# Patient Record
Sex: Male | Born: 1955
Health system: Southern US, Community
[De-identification: ages and names within clinical notes are randomized; demographics above are authoritative.]

## PROBLEM LIST (undated history)

## (undated) DIAGNOSIS — T7840XA Allergy, unspecified, initial encounter: Secondary | ICD-10-CM

## (undated) DIAGNOSIS — I10 Essential (primary) hypertension: Secondary | ICD-10-CM

## (undated) DIAGNOSIS — M1990 Unspecified osteoarthritis, unspecified site: Secondary | ICD-10-CM

## (undated) DIAGNOSIS — K5790 Diverticulosis of intestine, part unspecified, without perforation or abscess without bleeding: Secondary | ICD-10-CM

## (undated) HISTORY — DX: Unspecified osteoarthritis, unspecified site: M19.90

## (undated) HISTORY — DX: Diverticulosis of intestine, part unspecified, without perforation or abscess without bleeding: K57.90

## (undated) HISTORY — PX: JOINT REPLACEMENT: SHX530

## (undated) HISTORY — PX: CARPAL TUNNEL RELEASE: SHX101

## (undated) HISTORY — DX: Allergy, unspecified, initial encounter: T78.40XA

## (undated) HISTORY — DX: Essential (primary) hypertension: I10

## (undated) HISTORY — DX: Morbid (severe) obesity due to excess calories: E66.01

---
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
## 2000-08-31 ENCOUNTER — Encounter: Admission: RE | Admit: 2000-08-31 | Discharge: 2000-08-31 | Payer: Self-pay | Admitting: Family Medicine

## 2000-08-31 ENCOUNTER — Encounter: Payer: Self-pay | Admitting: Family Medicine

## 2005-10-23 ENCOUNTER — Ambulatory Visit: Payer: Self-pay | Admitting: Sports Medicine

## 2005-11-16 ENCOUNTER — Encounter: Admission: RE | Admit: 2005-11-16 | Discharge: 2006-02-14 | Payer: Self-pay | Admitting: Family Medicine

## 2006-02-03 ENCOUNTER — Encounter: Admission: RE | Admit: 2006-02-03 | Discharge: 2006-02-03 | Payer: Self-pay | Admitting: Family Medicine

## 2006-12-22 ENCOUNTER — Ambulatory Visit: Payer: Self-pay | Admitting: Family Medicine

## 2006-12-30 ENCOUNTER — Ambulatory Visit: Payer: Self-pay | Admitting: Family Medicine

## 2007-02-22 ENCOUNTER — Inpatient Hospital Stay (HOSPITAL_COMMUNITY): Admission: RE | Admit: 2007-02-22 | Discharge: 2007-02-26 | Payer: Self-pay | Admitting: Orthopedic Surgery

## 2007-02-24 ENCOUNTER — Ambulatory Visit: Payer: Self-pay | Admitting: Physical Medicine & Rehabilitation

## 2008-03-08 ENCOUNTER — Ambulatory Visit: Payer: Self-pay | Admitting: Family Medicine

## 2009-07-17 ENCOUNTER — Ambulatory Visit: Payer: Self-pay | Admitting: Family Medicine

## 2009-08-26 LAB — HM COLONOSCOPY: HM Colonoscopy: NORMAL

## 2011-02-13 ENCOUNTER — Other Ambulatory Visit: Payer: Self-pay | Admitting: Family Medicine

## 2011-02-17 NOTE — H&P (Signed)
Jeremy Ward, Jeremy Ward             ACCOUNT NO.:  [id number]   MEDICAL RECORD NO.:  [id number]          PATIENT TYPE:  INP   LOCATION:  NA                           FACILITY:  [facility]   PHYSICIAN:  Madlyn Frankel. Charlann Boxer, M.D.  DATE OF BIRTH:  1956-08-10   DATE OF ADMISSION:  DATE OF DISCHARGE:                              HISTORY & PHYSICAL   Procedure will be a right total hip replacement.   CHIEF COMPLAINTS:  Right hip pain.   HISTORY OF PRESENT ILLNESS:  Mr. Dilger is a 55 year old male with a  history of groin and buttock discomfort.  Evaluation had revealed severe  right hip osteoarthritis.  He has been refractory to all conservative  treatments including oral anti-inflammatories and cortisone injections.  He is seen by primary care physician, Dr. Sharlot Gowda.   PAST MEDICAL HISTORY:  Includes:  1. Osteoarthritis.  2. Borderline diabetes.  3. Scoliosis.  4. Hepatitis C diagnosed in 1980 as non A, non B.   FAMILY HISTORY:  Heart disease, hypertension, arthritis, gout.   SOCIAL HISTORY:  He is married.  Primary caregiver after surgery will be  wife and daughter.   DRUG ALLERGIES:  No known drug allergies.   MEDICATIONS:  Include HCTZ, verapamil, K-Dur, Nasonex and tramadol.   REVIEW OF SYSTEMS:  None other than HPI.   PHYSICAL EXAMINATION:  Pulse 72, respirations 18, blood pressure 144/88.  GENERAL:  He is awake, alert and oriented, well-developed, well-  nourished, no acute distress.  NECK:  Supple.  No carotid bruits.  CHEST/LUNGS:  Clear to auscultation bilaterally.  BREASTS:  Deferred.  HEART:  Regular rate and rhythm without gallops, clicks, rubs or  murmurs.  ABDOMEN:  Soft, nontender, nondistended.  Obese.  GENITOURINARY:  Deferred.  EXTREMITIES:  Decreased range of motion to right hip.  Painful with any  range of motion.  SKIN:  Dorsalis pedis pulses positive, right lower extremity.  No signs  of cellulitis.  NEUROLOGIC:  Intact distal sensibilities.   Labs,  EKG, chest x-ray are all pending presurgical clearance.   IMPRESSION:  1. Right hip osteoarthritis.  2. Borderline diabetes.  3. Scoliosis.  4. Hepatitis non A non B.   PLAN OF ACTION:  Right total hip replacement, Dr. Durene Romans, [facility], Feb 22, 2007.  Risks and complications were discussed.  Questions were encouraged, answered and reviewed.     ______________________________  Yetta Glassman [address], [place]      Madlyn Frankel. Charlann Boxer, M.D.  Electronically Signed    BLM/MEDQ  D:  02/16/2007  T:  02/16/2007  Job:  [id number]

## 2011-02-17 NOTE — Op Note (Signed)
Jeremy Ward, Jeremy Ward             ACCOUNT NO.:  [id number]   MEDICAL RECORD NO.:  [id number]          PATIENT TYPE:  INP   LOCATION:  0003                         FACILITY:  [facility]   PHYSICIAN:  Madlyn Frankel. Charlann Boxer, M.D.  DATE OF BIRTH:  1956/03/16   DATE OF PROCEDURE:  02/22/2007  DATE OF DISCHARGE:                               OPERATIVE REPORT   PREOPERATIVE DIAGNOSIS:  Right hip osteoarthritis.   POSTOPERATIVE DIAGNOSIS:  Right hip osteoarthritis.   PROCEDURE:  Right total hip replacement.   COMPONENTS USED:  DePuy hip system with size 54 ASR cup, a Tri-Lock size  2 high-offset stem, with a 47 +5 ASR ball and adapter.   SURGEON:  Madlyn Frankel. Charlann Boxer, M.D.   ASSISTANT:  Dwyane Luo, PA-C   ANESTHESIA:  General.   BLOOD LOSS:  900 mL.   DRAINS:  None.   COMPLICATIONS:  None.   INDICATIONS FOR PROCEDURE:  Jeremy Ward is a 55 year old gentleman who  initially presented to the office in January with end-stage degenerative  changes of his right hip and he had decreased quality of life and.  As  would be normally expected, he had some concerns about proceed with  surgical intervention but his quality of life and pain level eventually  led to the decision for hip replacement.  We had reviewed the risks and  benefits of infection, dislocation, need for revision surgery, DVT, all  in exchange for the possibility of pain relief.  Consent was obtained.   PROCEDURE IN DETAIL:  The patient was brought to the operative theater.  Once adequate anesthesia and preoperative antibiotics, 2 g of Ancef,  were administered, the patient was positioned in the left lateral  decubitus position.  Jeremy Ward is morbidly obese with a weight of  reported 350 pounds but appears a little bit heavier.  His right lower  extremity was then prepped into a sterile field from the perineum down  to the mid leg with following a pre scrub.   A lateral-based incision was made for a posterior approach to the hip as  there was a significant probably 4-5 inch subcutaneous fat layer even in  the lateral position.  The iliotibial band and gluteus fascia were  incised in line with the incision for a posterior approach to the hip  and the short external rotators were taken down separate from the  posterior capsule.  The patient was noted to have severe degenerative  changes and the capsule was fairly ratty.  I kept the posterior capsular  leaflet for protection from the retractors against the sciatic nerve but  was unable to repair this postprocedure so a capsulectomy ended up being  performed at the end.  Following exposure, the hip was dislocated.  The  patient was noted to have severe degenerative changes in both the  femoral and acetabular sides with osteophytes present on the posterior  aspect and an osteophyte of the labrum identified.   At this point using a trial prosthesis, a neck osteotomy was made.   Attention was first directed to the femur.  The box osteotome was used  to help set orientation of the rotation at 20 degrees anteversion.  I  then used a starting reamer and a hand reamer to open up the canal to  prevent fat emboli.  The canal was irrigated.  I then began broaching  with a 0, then a 1, and then a 2 .  With the 2 I had it just at the  level of the neck cut with good, secure fit.  I was unable to get this  any further so this stopped at this level.  I went ahead and packed the  canal to prevent excessive bleeding.   At this point attention was directed to the acetabulum.  Acetabular  exposure playing carried out with retractor placement.  Ossified labrum  and a portion of the superior capsule were debrided to allow for further  exposure.  I began reaming with a 45 straight reamer and then used the  curved reamers up to a 53 reamer.  It had some sclerotic bone that I  ended up reaming through with the smaller, even-numbered reamers with  the 50 and then the 52 reamer.  This allowed  for good bony bed  preparation.  Following the final irrigation and assurance of  debridement of the rim of the acetabulum, a 54 ASR cup was impacted with  a 35-40 degrees of abduction and 20 degrees of forward flexion beneath  the anterior wall anatomically positioned.   At this point attention was redirected back to the femur for trial  reduction.  The size 2 stem was impacted and then the high-offset neck  placed.  I felt that a +5 adapter worked best with a 47 ASR ball.  At  this point all the trial components were removed and the final Tri-Lock  size 2 stem high-offset stem was then impacted into a clean and prepared  canal.  Given the location of where it sat and the security of the final  fit, I used a +5 adapter with a 47 ASR ball.  This was impacted onto a  clean and dry trunion and the hip reduced.  Throughout the case the hip  was irrigated.  Hemostasis was carried out throughout the case including  the subcutaneous fat layer and then the posterior capsular tissue.  At  this point I determined that the posterior capsule and superior capsule  were unable to be reapproximated due to the debride purposes from the  significant arthritic and degenerative changes present to it.  I used 10  mL of FloSeal into the inferior and posterior capsular soft tissue  region and saved a small portion of it for the subcutaneous fat area,  particularly posteriorly.  The iliotibial band was reapproximated using  the #1 Vicryl and #1 Ethilon.  I then used #1 Vicryl running to the  gluteal fascia.  The remainder of layer was closed with 2-0 Vicryl in  the subcu layer and then running 4-0 Monocryl.  The hip was then  cleaned, dried and dressed sterilely with Steri-Strips and a Mepilex  dressing.  The patient was then extubated and brought to recovery in  stable condition.      Madlyn Frankel Charlann Boxer, M.D.  Electronically Signed    MDO/MEDQ  D:  02/22/2007  T:  02/22/2007  Job:  [id number]

## 2011-02-20 NOTE — Discharge Summary (Signed)
Jeremy Ward, Jeremy Ward             ACCOUNT NO.:  [id number]   MEDICAL RECORD NO.:  [id number]          PATIENT TYPE:  INP   LOCATION:  1605                         FACILITY:  [facility]   PHYSICIAN:  Madlyn Frankel. Charlann Boxer, M.D.  DATE OF BIRTH:  Jun 15, 1956   DATE OF ADMISSION:  02/22/2007  DATE OF DISCHARGE:  02/26/2007                               DISCHARGE SUMMARY   ADMITTING DIAGNOSES:  1. Osteoarthritis.  2. Borderline diabetes.  3. Scoliosis.  4. Hepatitis C.   DISCHARGE DIAGNOSES:  1. Osteoarthritis.  2. Borderline diabetes.  3. Scoliosis.  4. Hepatitis C.  5. Postoperative hypokalemia.   CONSULTS:  None.   PROCEDURE:  Right total hip replacement by surgeon Dr. Durene Romans.  Components:  Metal on metal.   HISTORY OF PRESENT ILLNESS:  Jeremy Ward is a 55 year old male with a  history of groin and buttock discomfort secondary to osteoarthritis.  It  has been refractory to all conservative treatments.  He was seen by his  primary care physician in presurgical assessment by Dr. Sharlot Gowda.   LABORATORIES ON ADMISSION:  CBC all stable, hematocrit 43.7.  Upon  discharge, hematocrit 31.3.  Coagulation within normal limits.  Chemistries preadmission:  Potassium 3.4, glucose 107.  Postoperative  hypokalemia 3.3, glucose 118.  May 22:  Sodium 134, glucose 107.  At  discharge, glucose 143.  All other stable and within normal limits.  GFR  checked, remained stable of greater than 60.  Calcium checked.  No  significant trending, at discharge calcium 8.5.  Routine chemistry GI  all within normal limits.  UA negative.  Type and cross A positive.  EKG:  Normal sinus rhythm.  Chest two-view:  No active cardiopulmonary  disease.   HOSPITAL COURSE:  The patient underwent right total hip placement,  tolerated the procedure well, was admitted to the orthopedic floor.  Pain was well-controlled throughout.  He was neurovascularly intact to  his right lower extremity throughout.  Dressing was  changed after  postoperative day #1 on a daily basis with no active drainage.  Physical  therapy started on postoperative day #1, progressed nicely throughout,  was a little bit apprehensive and worried about his progress but he did  progress to the point of minimal assistance with the use of rolling  walker prior to discharge.  Blood thinners were started on postoperative  day #1.  The patient was able to self-administer before discharge.  Mild  postoperative hypokalemia was replenished after postoperative day #1  with some K-Dur.  Postoperative day #3 still had not had a bowel  movement since surgery.  Between day #3 and day #4 did have a bowel  movement, was ready for discharge home, had progressed nicely, was  ambulating with minimal assistance with the use of a rolling walker.   DISCHARGE DISPOSITION:  Discharged home in stable and improved condition  with home health care physical therapy selected.   DISCHARGE DIET:  Regular.   DISCHARGE WOUND CARE:  Keep dry and change dressing on daily basis.   DISCHARGE ACTIVITY:  Walk with assistance with the use of a rolling  walker, weightbearing as tolerated.   DISCHARGE MEDICATIONS:  1. Lovenox 40 mg subcu q.24h. x11 days.  2. Robaxin 500 mg one p.o. q.6h. p.r.n. muscle spasm pain.  3. Vicodin 5/325 one to two p.o. q.4-6h. p.r.n. pain.  4. Iron 325 mg one p.o. t.i.d. x3 weeks.  5. Enteric-coated aspirin 325 mg one p.o. daily x4 weeks after Lovenox      complete.  6. Colace 1 mg p.o. b.i.d. constipation.  7. MiraLax 17 g one p.o. daily constipation.   Home medications:  1. Verapamil 360 mg one p.o. q.a.m.  2. Klor-Con 20 mEq two tablets daily.  3. Hydrochlorothiazide 25 mg one p.o. daily.  4. Nasonex nasal spray q.h.s.   DISCHARGE FOLLOWUP:  1. Follow up with Dr. Charlann Boxer [phone] in 2 weeks.  2. Discharge physical therapy.  Goals of physical therapy will be      weightbearing as tolerated with the use of a rolling walker with       transition to single-point cane in 2 weeks.  Want to minimize pain,      maximize range of motion, increase strength, and work on gait      retraining.  Want to work on proprioception as well.     ______________________________  Yetta Glassman. [address], [place]      Madlyn Frankel. Charlann Boxer, M.D.  Electronically Signed    BLM/MEDQ  D:  03/24/2007  T:  03/24/2007  Job:  [id number]

## 2011-05-05 ENCOUNTER — Other Ambulatory Visit: Payer: Self-pay | Admitting: Orthopedic Surgery

## 2011-05-05 DIAGNOSIS — M25551 Pain in right hip: Secondary | ICD-10-CM

## 2011-05-06 ENCOUNTER — Ambulatory Visit
Admission: RE | Admit: 2011-05-06 | Discharge: 2011-05-06 | Disposition: A | Discharge status: Home / Self Care | Payer: 59 | Source: Ambulatory Visit | Attending: Orthopedic Surgery | Admitting: Orthopedic Surgery

## 2011-05-06 DIAGNOSIS — M25551 Pain in right hip: Secondary | ICD-10-CM

## 2011-05-06 MED ORDER — IOHEXOL 180 MG/ML  SOLN
1.0000 mL | Freq: Once | INTRAMUSCULAR | Status: AC | PRN
  Administered 2011-05-06: 1 mL via INTRA_ARTICULAR

## 2011-05-15 ENCOUNTER — Other Ambulatory Visit: Payer: Self-pay | Admitting: Family Medicine

## 2011-05-15 NOTE — Telephone Encounter (Signed)
Pt needs an appointment

## 2011-06-12 ENCOUNTER — Ambulatory Visit (INDEPENDENT_AMBULATORY_CARE_PROVIDER_SITE_OTHER): Payer: BC Managed Care – PPO | Admitting: Family Medicine

## 2011-06-12 ENCOUNTER — Encounter: Payer: Self-pay | Admitting: Family Medicine

## 2011-06-12 VITALS — BP 132/88 | HR 82 | Wt 340.0 lb

## 2011-06-12 DIAGNOSIS — Z23 Encounter for immunization: Secondary | ICD-10-CM

## 2011-06-12 DIAGNOSIS — M25559 Pain in unspecified hip: Secondary | ICD-10-CM

## 2011-06-12 DIAGNOSIS — M25551 Pain in right hip: Secondary | ICD-10-CM

## 2011-06-12 DIAGNOSIS — I10 Essential (primary) hypertension: Secondary | ICD-10-CM

## 2011-06-12 DIAGNOSIS — Z01818 Encounter for other preprocedural examination: Secondary | ICD-10-CM

## 2011-06-12 DIAGNOSIS — Z79899 Other long term (current) drug therapy: Secondary | ICD-10-CM

## 2011-06-12 NOTE — Progress Notes (Signed)
  Subjective:    Patient ID: Jeremy Ward, male    DOB: 07-03-1956, 55 y.o.   MRN: [id number]  HPI Is here for surgical clearance for hip revision. He has a Depuy appliance which has been recalled. He has no chest pain, shortness of breath or other cardial respiratory symptoms. He is on blood pressure medications. He has no other concerns or complaints. He is using tramadol for pain but it is causing difficulty with constipation as well as a strange dreams.   Review of Systems Negative except as above.    Objective:   Physical Exam alert and in no distress. Tympanic membranes and canals are normal. Throat is clear. Tonsils are normal. Neck is supple without adenopathy or thyromegaly. Cardiac exam shows a regular sinus rhythm without murmurs or gallops. Lungs are clear to auscultation. EKG does show slight ST depressions compared to previous tracing.        Assessment & Plan:   1. Hypertension   2. Encounter for long-term (current) use of other medications   3. Right hip pain    obesity  abnormal EKG: Refer to cardiology before any surgery.

## 2011-06-13 LAB — LIPID PANEL
HDL: 50 mg/dL (ref >39)
Total CHOL/HDL Ratio: 3.5 Ratio
VLDL: 24 mg/dL (ref 0–40)

## 2011-06-13 LAB — CBC WITH DIFFERENTIAL/PLATELET
Basophils Absolute: 0 10*3/uL (ref 0.0–0.1)
Basophils Relative: 0 % (ref 0–1)
Eosinophils Absolute: 0.1 10*3/uL (ref 0.0–0.7)
Eosinophils Relative: 3 % (ref 0–5)
Lymphocytes Relative: 30 % (ref 12–46)
MCHC: 33.2 g/dL (ref 30.0–36.0)
MCV: 89.6 fL (ref 78.0–100.0)
Platelets: 185 10*3/uL (ref 150–400)
RDW: 14.7 % (ref 11.5–15.5)
WBC: 5 10*3/uL (ref 4.0–10.5)

## 2011-06-13 LAB — COMPREHENSIVE METABOLIC PANEL
ALT: 13 U/L (ref 0–53)
AST: 16 U/L (ref 0–37)
Alkaline Phosphatase: 57 U/L (ref 39–117)
BUN: 14 mg/dL (ref 6–23)
Creat: 1 mg/dL (ref 0.50–1.35)
Total Bilirubin: 0.7 mg/dL (ref 0.3–1.2)

## 2011-06-16 ENCOUNTER — Telehealth: Payer: Self-pay

## 2011-06-16 ENCOUNTER — Encounter: Payer: Self-pay | Admitting: Family Medicine

## 2011-06-16 NOTE — Telephone Encounter (Signed)
Called pt to inform of labs and to recheck potas. In a week or 2 left message

## 2011-06-17 ENCOUNTER — Other Ambulatory Visit: Payer: Self-pay | Admitting: Orthopedic Surgery

## 2011-06-17 ENCOUNTER — Other Ambulatory Visit (HOSPITAL_COMMUNITY): Payer: Self-pay | Admitting: Orthopedic Surgery

## 2011-06-17 ENCOUNTER — Encounter (HOSPITAL_COMMUNITY): Payer: BC Managed Care – PPO

## 2011-06-17 ENCOUNTER — Ambulatory Visit (HOSPITAL_COMMUNITY)
Admission: RE | Admit: 2011-06-17 | Discharge: 2011-06-17 | Disposition: A | Discharge status: Home / Self Care | Payer: BC Managed Care – PPO | Source: Ambulatory Visit | Attending: Orthopedic Surgery | Admitting: Orthopedic Surgery

## 2011-06-17 DIAGNOSIS — T8389XA Other specified complication of genitourinary prosthetic devices, implants and grafts, initial encounter: Secondary | ICD-10-CM | POA: Insufficient documentation

## 2011-06-17 DIAGNOSIS — Z01811 Encounter for preprocedural respiratory examination: Secondary | ICD-10-CM

## 2011-06-17 DIAGNOSIS — I10 Essential (primary) hypertension: Secondary | ICD-10-CM | POA: Insufficient documentation

## 2011-06-17 DIAGNOSIS — Z01812 Encounter for preprocedural laboratory examination: Secondary | ICD-10-CM | POA: Insufficient documentation

## 2011-06-17 DIAGNOSIS — Z01818 Encounter for other preprocedural examination: Secondary | ICD-10-CM | POA: Insufficient documentation

## 2011-06-17 DIAGNOSIS — Y831 Surgical operation with implant of artificial internal device as the cause of abnormal reaction of the patient, or of later complication, without mention of misadventure at the time of the procedure: Secondary | ICD-10-CM | POA: Insufficient documentation

## 2011-06-17 LAB — CBC
MCH: 29.8 pg (ref 26.0–34.0)
MCV: 87 fL (ref 78.0–100.0)
Platelets: 200 10*3/uL (ref 150–400)
RBC: 5.17 MIL/uL (ref 4.22–5.81)
RDW: 14.1 % (ref 11.5–15.5)
WBC: 7.2 10*3/uL (ref 4.0–10.5)

## 2011-06-17 LAB — URINALYSIS, ROUTINE W REFLEX MICROSCOPIC
Bilirubin Urine: NEGATIVE
Ketones, ur: NEGATIVE mg/dL
Leukocytes, UA: NEGATIVE
Nitrite: NEGATIVE
Specific Gravity, Urine: 1.018 (ref 1.005–1.030)
Urobilinogen, UA: 0.2 mg/dL (ref 0.0–1.0)
pH: 6 (ref 5.0–8.0)

## 2011-06-17 LAB — BASIC METABOLIC PANEL
BUN: 15 mg/dL (ref 6–23)
CO2: 28 mEq/L (ref 19–32)
Calcium: 10.2 mg/dL (ref 8.4–10.5)
Chloride: 102 mEq/L (ref 96–112)
Creatinine, Ser: 0.81 mg/dL (ref 0.50–1.35)

## 2011-06-17 LAB — DIFFERENTIAL
Basophils Relative: 1 % (ref 0–1)
Eosinophils Absolute: 0.2 10*3/uL (ref 0.0–0.7)
Eosinophils Relative: 3 % (ref 0–5)
Neutrophils Relative %: 61 % (ref 43–77)

## 2011-06-17 LAB — APTT: aPTT: 36 seconds (ref 24–37)

## 2011-06-18 ENCOUNTER — Telehealth: Payer: Self-pay | Admitting: Family Medicine

## 2011-06-18 NOTE — Telephone Encounter (Signed)
Have him come back for recheck on his blood but make sure you take an extra potassium

## 2011-06-19 LAB — MRSA CULTURE

## 2011-06-22 ENCOUNTER — Other Ambulatory Visit: Payer: Self-pay | Admitting: Orthopedic Surgery

## 2011-06-22 ENCOUNTER — Inpatient Hospital Stay (HOSPITAL_COMMUNITY): Payer: BC Managed Care – PPO

## 2011-06-22 ENCOUNTER — Inpatient Hospital Stay (HOSPITAL_COMMUNITY)
Admission: RE | Admit: 2011-06-22 | Discharge: 2011-06-24 | DRG: 817 | Disposition: A | Discharge status: Home Health | Payer: BC Managed Care – PPO | Source: Ambulatory Visit | Attending: Orthopedic Surgery | Admitting: Orthopedic Surgery

## 2011-06-22 DIAGNOSIS — Z01812 Encounter for preprocedural laboratory examination: Secondary | ICD-10-CM

## 2011-06-22 DIAGNOSIS — T84099A Other mechanical complication of unspecified internal joint prosthesis, initial encounter: Principal | ICD-10-CM | POA: Diagnosis present

## 2011-06-22 DIAGNOSIS — Z01818 Encounter for other preprocedural examination: Secondary | ICD-10-CM

## 2011-06-22 DIAGNOSIS — E876 Hypokalemia: Secondary | ICD-10-CM | POA: Diagnosis not present

## 2011-06-22 DIAGNOSIS — I10 Essential (primary) hypertension: Secondary | ICD-10-CM | POA: Diagnosis present

## 2011-06-22 DIAGNOSIS — Y831 Surgical operation with implant of artificial internal device as the cause of abnormal reaction of the patient, or of later complication, without mention of misadventure at the time of the procedure: Secondary | ICD-10-CM | POA: Diagnosis present

## 2011-06-22 DIAGNOSIS — Z96649 Presence of unspecified artificial hip joint: Secondary | ICD-10-CM

## 2011-06-22 DIAGNOSIS — Z8619 Personal history of other infectious and parasitic diseases: Secondary | ICD-10-CM

## 2011-06-22 LAB — TYPE AND SCREEN
ABO/RH(D): A POS
Antibody Screen: NEGATIVE

## 2011-06-23 LAB — BASIC METABOLIC PANEL
Calcium: 8 mg/dL — ABNORMAL LOW (ref 8.4–10.5)
GFR calc Af Amer: >60 mL/min (ref >60)
GFR calc non Af Amer: >60 mL/min (ref >60)
Glucose, Bld: 108 mg/dL — ABNORMAL HIGH (ref 70–99)
Potassium: 3 mEq/L — ABNORMAL LOW (ref 3.5–5.1)
Sodium: 134 mEq/L — ABNORMAL LOW (ref 135–145)

## 2011-06-23 LAB — CBC
Hemoglobin: 11.7 g/dL — ABNORMAL LOW (ref 13.0–17.0)
MCH: 29.5 pg (ref 26.0–34.0)
MCHC: 33.6 g/dL (ref 30.0–36.0)
Platelets: 170 10*3/uL (ref 150–400)
RDW: 14 % (ref 11.5–15.5)

## 2011-06-24 LAB — CBC
MCH: 30 pg (ref 26.0–34.0)
Platelets: 157 10*3/uL (ref 150–400)
RBC: 3.6 MIL/uL — ABNORMAL LOW (ref 4.22–5.81)

## 2011-06-24 LAB — BASIC METABOLIC PANEL
CO2: 28 mEq/L (ref 19–32)
Calcium: 8.1 mg/dL — ABNORMAL LOW (ref 8.4–10.5)
GFR calc non Af Amer: >60 mL/min (ref >60)
Potassium: 2.7 mEq/L — CL (ref 3.5–5.1)
Sodium: 135 mEq/L (ref 135–145)

## 2011-06-24 NOTE — Telephone Encounter (Signed)
Spoke with pt and pt stated that he was in hospital for knee surgery.  He wanted you to know this and that his potassium is up a little.  Pt also stated that he will come in for a nurse visit in a few weeks to get it checked again.  CM,LPN

## 2011-06-25 NOTE — Op Note (Signed)
[name], [name] NO.:  [id number]  MEDICAL RECORD NO.:  [id number]  LOCATION:  0009                         FACILITY:  [facility]  PHYSICIAN:  Madlyn Frankel. Charlann Boxer, M.D.  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  FINDINGS:  The patient was noted to have some metal staining.  No evidence of muscle or bone damage or necrosis.  Small fluid collection noted within the acetabular region.  PROCEDURE:  Revision right hip surgery with removal of ASR head and cup, placement of a 56 Gription Pinnacle cup with a 36 +4 neutral AltrX liner and 36 +5 Delta ceramic ball.  SURGEON:  Madlyn Frankel. Charlann Boxer, MD  ASSISTANT:  Lanney Gins, PA-C  ANESTHESIA:  General.  SPECIMENS:  Femoral head and cup were sent to the Pathology per attorney request.  DRAINS:  One Hemovac.  BLOOD LOSS:  About 500 cc.  INDICATIONS OF PROCEDURE:  Jeremy Ward is a 55 year old patient of mine with history of a right total hip replacement done utilizing ASR hip system.  He had initially done well with regard to his hip, however, began having some discomfort over the past year or so.  The lab work revealed mildly elevated serum cobalt chromium levels.  In March, MRI revealed fluid collection.  Given his symptoms and these findings, he wished at this point to have his hip revised.  Risks of infection, DVT, component failure, dislocation, revision, setting were all discussed and reviewed.  Consent was obtained for benefit of pain relief.  PROCEDURE IN DETAIL:  The patient was brought to operative theater. Once adequate anesthesia, preoperative antibiotics, Ancef 2  g administered, he was positioned into the left lateral decubitus position with the right side up.  The right lower extremity was then prepped and draped in sterile fashion.  A time-out was performed identifying the patient, the planned procedure, and extremity.  The patient's old incision was identified, I marked this out, but extended it about 2 cm on the proximal distal end.  The old incision was excised, sharp dissection was carried down identifying a layer within the subcutaneous fat of a pseudocapsular area that I excised the pseudocapsular tissue and then turned debridement, cauterizing as we went down.  The iliotibial band and gluteal fascia were identified and then incised for posterior approach.  Once I was down on the posterior aspect of the capsule and once I incised the pseudocapsular layer, we identified the metal-stained fluid as well as a metal-stained synovial lining.  This was debrided sharply as I exposed the posterior two thirds of the acetabulum.  Once this area was exposed, the hip was dislocated and the femoral head removed.  The mobilization of the patient's hip was significantly compromised due to his size.  For that reason, required a significant amount of effort from physician assistant in terms of retraction and holding in leveraging the femur out  of place using a retractor using a bone hook.  I was able to remove the acetabular shell using standard osteotomes after going around the two-thirds posterior and to the proximal and distal portions and the cup was removed.  I then reamed.  Once it was a 54-mm cup, so I reamed with a 55 reamer, removing any of the fibers based down to good base bone.  A 56 Gription cup was chosen.  Based on limited mobility of his femur in relationship to the acetabulum, I had to placed the shell into the acetabulum itself. Once I had it oriented correctly by tapping it down with some pressure applied for a large femoral head  impactor, I felt that I had the cup position at about 40 degrees of abduction and positioned 20 degrees of forward flexion with the anterior rim exposed above the cup.  The cup was then impacted and then well seated and confirmed visually with the use of a Frazier tip suction.  I placed 2 cancellous screws into the ilium.  Based on the orientation of the cup and the fixation, I went ahead and placed a hole eliminator and placed a 36 +4 neutral AltrX liner which was impacted with good visualized rim fit.  At this point, I did a trial reduction with 36 +1.5 ball.  I chose a 36 +5 ball based on the trial reduction and the fact that the old ball had a +5 adapter on it.  The final 36 +5 Delta ceramic ball was then impacted onto clean and dry trunnion and the hip was reduced.  The hip had been irrigated throughout the case and again at this point. I did not reapproximate the pseudocapsule posteriorly.  I placed a medium Hemovac drain deep.  The iliotibial band and gluteal fascia were then reapproximated over top of this using #1 Vicryl.  The remainder of wound was closed with 2-0 Vicryl and running 4-0 Monocryl and staples on the skin.  The skin was cleaned, dried, and dressed sterilely using an Aquacel dressing.  The patient was brought to recovery room in stable condition, tolerating the procedure well.     Madlyn Frankel Charlann Boxer, M.D.     MDO/MEDQ  D:  06/22/2011  T:  06/22/2011  Job:  [id number]  Electronically Signed by Durene Romans M.D. on 06/25/2011 08:40:00 PM

## 2011-06-25 NOTE — H&P (Signed)
NAMEKAIZER, DISSINGER NO.:  [id number]  MEDICAL RECORD NO.:  [id number]  LOCATION:                                 FACILITY:  PHYSICIAN:  Madlyn Frankel. Charlann Boxer, M.D.  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  HISTORY OF PRESENT ILLNESS:  This is a 55 year old gentleman with a history of an ASR total hip arthroplasty on the right in 2008, with metallosis, pain, and failure of the prosthesis.  He is now scheduled for revision of this.  The surgery risks, benefits, and aftercare were discussed in detail with the patient.  Questions invited and answered. Surgery to go ahead as scheduled.  Note that he is a candidate for tranexamic acid, will receive that preop.  He also will be going home after surgery in the care of his wife.  He is given his postop medications of aspirin, Robaxin, iron, MiraLax and Colace to take postoperatively and his medical doctor is Dr. Viann Fish.  PAST MEDICAL HISTORY:  None.  DRUG ALLERGIES:  None.  CURRENT MEDICATIONS: 1. Verapamil 360 mg one daily. 2. Hydrochlorothiazide 25 mg one daily.  PREVIOUS SURGERIES:  Tonsillectomy, bilateral eyes surgery, left carpal tunnel release, and hip replacement on the right.  SERIOUS MEDICAL ILLNESSES:  Include hypertension.  FAMILY HISTORY:  Positive for COPD and emphysema.  SOCIAL HISTORY:  The patient is married.  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  He does not smoke and does not drink.  REVIEW OF SYSTEMS:  CENTRAL NERVOUS SYSTEM:  Negative for headache, blurred vision, or dizziness.  PULMONARY: Negative for shortness breath, PND, and orthopnea.  CARDIOVASCULAR:  No chest pain or palpitation.  GI: Negative for ulcers, hepatitis.  GU:  Negative for urinary tract difficulty.  MUSCULOSKELETAL:  Positive in  HPI.  PHYSICAL EXAMINATION:  VITAL SIGNS:  BP 164/84, respirations 20, pulse 80 and regular. GENERAL APPEARANCE:  This is an obese gentleman in no acute distress. HEENT:  Head normocephalic.  Nose patent.  Ears patent.  Pupils are equal, round, reactive to light.  He does have slight decreased range of motion of his eyes secondary to previous eye muscle surgery. NECK:  Supple without adenopathy.  Carotids 2+ without bruit. CHEST:  Clear to auscultation.  No rales or rhonchi.  Respirations 20. HEART:  Regular rate and rhythm at 80 beats per minute without murmur. ABDOMEN:  Soft.  Active bowel sounds.  No masses or organomegaly. NEUROLOGIC:  The patient is alert and oriented to time, place, and person.  Cranial nerves II-XII grossly intact. EXTREMITIES:  Shows the right hip with decreased range of motion with pain especially on internal rotation.  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  PLAN:  Revision, right total hip arthroplasty.     Jaquelyn Bitter. Chabon, P.A.______________________________ Madlyn Frankel Charlann Boxer, M.D.    SJC/MEDQ  D:  06/17/2011  T:  06/17/2011  Job:  [id number]  Electronically Signed by Jodene Nam P.A. on 06/24/2011 02:19:47 PM Electronically Signed  by Durene Romans M.D. on 06/25/2011 08:39:52 PM

## 2011-06-25 NOTE — Discharge Summary (Signed)
NAMECOSTON, MANDATO NO.:  [id number]  MEDICAL RECORD NO.:  [id number]  LOCATION:  1534                         FACILITY:  [facility]  PHYSICIAN:  Madlyn Frankel. Charlann Boxer, M.D.  DATE OF BIRTH:  1956/05/20  DATE OF ADMISSION:  06/22/2011 DATE OF DISCHARGE:  06/24/2011                              DISCHARGE SUMMARY   PROCEDURE:  Revision of the right hip surgery with removal of the ASR head and cup.  ATTENDING PHYSICIAN:  Madlyn Frankel. Charlann Boxer, M.D.  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  DISCHARGE DIAGNOSES: 1. Status post right total hip revision. 2. Hypertension. 3. Hypokalemia.  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  Options were discussed with the patient.  The patient wishes to proceed with the revision.  Risks, benefits and expectations of the procedure were discussed with the patient.  The patient understands risks, benefits and expectations and wishes to proceed with revision of the right total hip.  HOSPITAL COURSE:  The patient underwent the above-stated procedure on June 22, 2011.  The patient tolerated the procedure well, was brought to the recovery room in good condition and subsequently to the floor.  Postop day #1, June 23, 2011:  The patient doing really well, no events.  The patient's pain is well-controlled, afebrile, vital signs stable.  H and H 11.7/34.8. Dressings good, clean, dry and intact. Distally and neurovascularly intact.  Hemovac drain was removed.  The patient allowed physical therapy.  Postop day #2, June 24, 2011:  The patient continues to do really well.  No incidences.  Pain is well-controlled.  The patient did however has low potassium. Potassium chloride was ordered for the  patient to be given to him twice.  The patient does have a history of this, has been recently given a prescription by his primary care physician.  H and H is 10.8 and 32.0, afebrile, vital signs stable.  Dressing is good, clean, dry and intact.  Distally, neurovascular intact.  The patient's IV will be changed to saline lock.  He is given 2 doses of potassium chloride before being discharged.  It was felt the patient was doing well enough to be discharged home.  DISCHARGE CONDITION:  Good.  DISCHARGE INSTRUCTIONS:  The patient will be discharged home on June 24, 2011.  The patient will have 2 episodes of physical therapy and 2 doses of potassium chloride before being discharged home. The patient will be weightbearing as tolerated.  The patient will have home health PT q. month and follow him.  The patient should maintain his surgical dressing for about 8 days after which time he will replace with gauze and tape.  The patient is to keep the area dry and clean until followup.  The patient will follow up with the [facility] in [facility] weeks.  The patient is to call with any questions or concerns.  DISCHARGE MEDICATIONS: 1. Benadryl 25 mg one p.o. q.4 hours p.r.n. 2. Colace 100 mg  one p.o. b.i.d. constipation. 3. Iron sulfate 325 mg one p.o. t.i.d. times 2-3 weeks. 4. Norco 7.5/325 one to two p.o. q.4-6 hours p.r.n. pain. 5. MiraLAX 17 g p.o. q. day constipation. 6. Robaxin 500 mg one p.o. q.6 hours p.r.n. muscle spasms. 7. Cetirizine 10 mg one p.o. q. day. 8. Hydrochlorothiazide 25 mg one p.o. q.a.m. 9. Klor-Con 20 mEq to take as instructed by his primary care     physician.  The patient should follow up with his primary care     physician regarding this medication. 10.Verapamil 360 mg one p.o. q.a.m.    ______________________________ Lanney Gins, PA   ______________________________ Madlyn Frankel. Charlann Boxer, M.D.    MB/MEDQ  D:  06/24/2011  T:  06/24/2011  Job:   [id number]  Electronically Signed by Lanney Gins PA on 06/25/2011 01:53:05 PM Electronically Signed by Durene Romans M.D. on 06/25/2011 08:40:03 PM

## 2011-07-02 ENCOUNTER — Other Ambulatory Visit: Payer: BC Managed Care – PPO

## 2011-07-02 DIAGNOSIS — E876 Hypokalemia: Secondary | ICD-10-CM

## 2011-07-03 ENCOUNTER — Telehealth: Payer: Self-pay

## 2011-07-03 LAB — BASIC METABOLIC PANEL
Chloride: 100 mEq/L (ref 96–112)
Glucose, Bld: 112 mg/dL — ABNORMAL HIGH (ref 70–99)
Potassium: 4.1 mEq/L (ref 3.5–5.3)
Sodium: 141 mEq/L (ref 135–145)

## 2011-07-03 NOTE — Telephone Encounter (Signed)
Called pt to inform to continue potas.until all gone and recheck in 2 months

## 2011-07-31 ENCOUNTER — Telehealth: Payer: Self-pay | Admitting: Internal Medicine

## 2011-07-31 ENCOUNTER — Telehealth: Payer: Self-pay | Admitting: Family Medicine

## 2011-07-31 NOTE — Telephone Encounter (Signed)
Let him know that we'll be happy to work with him controlling this fluid but would like to see him first to go over the various options

## 2011-07-31 NOTE — Telephone Encounter (Signed)
If he has an infection I definitely need to see him

## 2011-07-31 NOTE — Telephone Encounter (Signed)
Pt has an Ov for Monday to discuss medication increase for HCTZ

## 2011-07-31 NOTE — Telephone Encounter (Signed)
Called pt to tell him what you said but he said he went to his Dr. Charlann Boxer today and the Dr. York Spaniel he had and infection and the veins were not opening up like they should and retaining to much fluid and the dr. Asked if he could call to see if you could increase the HCTZ just for a short time until they got the infection under control

## 2011-08-03 ENCOUNTER — Encounter: Payer: Self-pay | Admitting: Family Medicine

## 2011-08-03 ENCOUNTER — Ambulatory Visit (INDEPENDENT_AMBULATORY_CARE_PROVIDER_SITE_OTHER): Payer: BC Managed Care – PPO | Admitting: Family Medicine

## 2011-08-03 VITALS — BP 126/80 | HR 76 | Wt 340.0 lb

## 2011-08-03 DIAGNOSIS — L0390 Cellulitis, unspecified: Secondary | ICD-10-CM

## 2011-08-03 DIAGNOSIS — L0291 Cutaneous abscess, unspecified: Secondary | ICD-10-CM

## 2011-08-03 DIAGNOSIS — R609 Edema, unspecified: Secondary | ICD-10-CM

## 2011-08-03 DIAGNOSIS — Z96649 Presence of unspecified artificial hip joint: Secondary | ICD-10-CM

## 2011-08-03 MED ORDER — FUROSEMIDE 20 MG PO TABS: 20.0000 mg | ORAL_TABLET | Freq: Every day | ORAL | Status: DC

## 2011-08-03 MED ORDER — HYDROXYZINE HCL 25 MG PO TABS: 25.0000 mg | ORAL_TABLET | Freq: Three times a day (TID) | ORAL | Status: AC | PRN

## 2011-08-03 NOTE — Patient Instructions (Addendum)
Keep your legs elevated. Uses support hose daily. No more Benadryl but use Claritin or Allegra for the itching. Take the hydroxyzine at night for the itching Cool compresses to the area that itch the most.

## 2011-08-03 NOTE — Progress Notes (Signed)
  Subjective:    Patient ID: Jeremy Ward, male    DOB: Oct 30, 1955, 55 y.o.   MRN: [id number]  HPI He was recently seen by his orthopedic surgeon for evaluation of swelling and redness of the right shin area. He is placed on a cephalosporin and he states that the redness and discomfort are much better. He still does have some itching especially in the calf area. He is also noted increased swelling which is having difficulty with. He states when he elevates his legs had actually makes the calf area more pruritic. He had recent hip surgery.   Review of Systems     Objective:   Physical Exam Alert and in no distress. Examination of both lower extremities show 2-3+ pitting edema in his feet. There is also slight erythema but no warmth to the right anterior shin. It has a reddish-brown appearance as opposed to reddish.       Assessment & Plan:   1. Peripheral edema   2. Cellulitis   3. S/P hip replacement    Had a long discussion with him concerning this. I will give him Atarax to help with the itching. He'll continue on the antibiotic. He is to keep his leg elevated as much is possible. He can use cold compresses on the itching areas. I will also give him a small dose of Lasix to help with the edema.

## 2011-08-07 ENCOUNTER — Telehealth: Payer: Self-pay | Admitting: Family Medicine

## 2011-08-07 MED ORDER — FLUCONAZOLE 150 MG PO TABS: 150.0000 mg | ORAL_TABLET | Freq: Once | ORAL | Status: DC

## 2011-08-07 NOTE — Telephone Encounter (Signed)
Diflucan called in for yeast infection. He has had difficulty with these symptoms before and responded well to this medicine.

## 2011-08-10 ENCOUNTER — Encounter: Payer: Self-pay | Admitting: Family Medicine

## 2011-08-10 ENCOUNTER — Ambulatory Visit (INDEPENDENT_AMBULATORY_CARE_PROVIDER_SITE_OTHER): Payer: BC Managed Care – PPO | Admitting: Family Medicine

## 2011-08-10 VITALS — BP 124/80 | HR 62 | Wt 344.0 lb

## 2011-08-10 DIAGNOSIS — L0291 Cutaneous abscess, unspecified: Secondary | ICD-10-CM

## 2011-08-10 DIAGNOSIS — L0390 Cellulitis, unspecified: Secondary | ICD-10-CM

## 2011-08-10 DIAGNOSIS — B356 Tinea cruris: Secondary | ICD-10-CM

## 2011-08-10 MED ORDER — HYDROCHLOROTHIAZIDE 25 MG PO TABS: 25.0000 mg | ORAL_TABLET | Freq: Every day | ORAL | Status: DC

## 2011-08-10 MED ORDER — CEPHALEXIN 500 MG PO CAPS: 500.0000 mg | ORAL_CAPSULE | Freq: Four times a day (QID) | ORAL | Status: DC

## 2011-08-10 MED ORDER — FLUCONAZOLE 150 MG PO TABS: 150.0000 mg | ORAL_TABLET | Freq: Once | ORAL | Status: AC

## 2011-08-10 MED ORDER — VERAPAMIL HCL ER 360 MG PO CP24: 360.0000 mg | ORAL_CAPSULE | Freq: Every day | ORAL | Status: DC

## 2011-08-10 NOTE — Patient Instructions (Signed)
Her use an antifungal cream on the scrotal area as well as the pill take the pill on an as-needed basis

## 2011-08-10 NOTE — Progress Notes (Signed)
  Subjective:    Patient ID: Jeremy Ward, male    DOB: 14-Jan-1956, 55 y.o.   MRN: [id number]  HPI He is here for recheck. He is doing much better. He is noting much less discomfort as well as swelling in his extremities. He has used Diflucan to help with tinea cruris. This usually does a very good job with taking care of his problem.  Review of Systems     Objective:   Physical Exam Exam of the lower extremities does show less warmth and induration as well as swelling.      Assessment & Plan:   1. Cellulitis   2. Tinea cruris    continue on Keflex for another 2 weeks. I will also give Diflucan to help with tinea.

## 2011-08-11 ENCOUNTER — Telehealth: Payer: Self-pay | Admitting: Internal Medicine

## 2011-08-11 NOTE — Telephone Encounter (Signed)
Jeremy Ward went to pick up his verapamil and the pharmacist said that's not what he has been taken that you had prescribed him orignially 360 mg SR and you changed it to 360mg  24 hr capsule. Wants to know if that was what you wanted him to take or what it done by mistake and keep him on 360mg  SR

## 2011-08-11 NOTE — Telephone Encounter (Signed)
Called pharmacy and okayed it

## 2011-08-11 NOTE — Telephone Encounter (Signed)
He can have the verapamil 24 hour variety

## 2011-08-25 ENCOUNTER — Ambulatory Visit (INDEPENDENT_AMBULATORY_CARE_PROVIDER_SITE_OTHER): Payer: BC Managed Care – PPO | Admitting: Family Medicine

## 2011-08-25 ENCOUNTER — Encounter: Payer: Self-pay | Admitting: Family Medicine

## 2011-08-25 VITALS — BP 112/80 | HR 68 | Temp 97.6°F

## 2011-08-25 DIAGNOSIS — M25559 Pain in unspecified hip: Secondary | ICD-10-CM

## 2011-08-25 DIAGNOSIS — M25551 Pain in right hip: Secondary | ICD-10-CM

## 2011-08-25 DIAGNOSIS — L0390 Cellulitis, unspecified: Secondary | ICD-10-CM

## 2011-08-25 DIAGNOSIS — IMO0001 Reserved for inherently not codable concepts without codable children: Secondary | ICD-10-CM

## 2011-08-25 DIAGNOSIS — M791 Myalgia, unspecified site: Secondary | ICD-10-CM

## 2011-08-25 DIAGNOSIS — L0291 Cutaneous abscess, unspecified: Secondary | ICD-10-CM

## 2011-08-25 MED ORDER — CEPHALEXIN 500 MG PO CAPS: 500.0000 mg | ORAL_CAPSULE | Freq: Four times a day (QID) | ORAL | Status: DC

## 2011-08-25 NOTE — Progress Notes (Signed)
  Subjective:    Patient ID: Jeremy Ward, male    DOB: 11/29/1955, 54 y.o.   MRN: [id number]  HPI He is here for recheck. He states that his leg is doing much better but not completely back to normal. He also has had more difficulty with his left hip pain. He is also noted some increase in his aches and pains in concerned about low potassium which he thinks might be causing this.   Review of Systems     Objective:   Physical Exam Alert and in no distress. Exam of his lower extremities does show the right leg to have less warmth. The skin is more pinkish and tone is compared to prior to this which was more reddish purple.       Assessment & Plan:   1. Myalgia  Comprehensive metabolic panel, CK (Creatine Kinase)  2. Hip pain    3. Right hip pain    4. Cellulitis    I will do some routine blood screening to evaluate for myalgias. He will discuss hip pain with his orthopedic surgeon. I will continue him on the antibiotic for another 10 days.

## 2011-08-26 LAB — COMPREHENSIVE METABOLIC PANEL
ALT: 10 U/L (ref 0–53)
AST: 14 U/L (ref 0–37)
Albumin: 3.5 g/dL (ref 3.5–5.2)
Alkaline Phosphatase: 62 U/L (ref 39–117)
Chloride: 105 mEq/L (ref 96–112)
Potassium: 3.2 mEq/L — ABNORMAL LOW (ref 3.5–5.3)
Sodium: 145 mEq/L (ref 135–145)
Total Protein: 5.9 g/dL — ABNORMAL LOW (ref 6.0–8.3)

## 2011-11-09 ENCOUNTER — Ambulatory Visit (INDEPENDENT_AMBULATORY_CARE_PROVIDER_SITE_OTHER): Payer: BC Managed Care – PPO | Admitting: Family Medicine

## 2011-11-09 VITALS — BP 175/79 | HR 87 | Temp 97.8°F | Resp 16 | Ht 65.0 in | Wt 358.0 lb

## 2011-11-09 DIAGNOSIS — J310 Chronic rhinitis: Secondary | ICD-10-CM

## 2011-11-09 DIAGNOSIS — R059 Cough, unspecified: Secondary | ICD-10-CM

## 2011-11-09 DIAGNOSIS — R05 Cough: Secondary | ICD-10-CM

## 2011-11-09 DIAGNOSIS — J019 Acute sinusitis, unspecified: Secondary | ICD-10-CM

## 2011-11-09 DIAGNOSIS — J3489 Other specified disorders of nose and nasal sinuses: Secondary | ICD-10-CM

## 2011-11-09 MED ORDER — CEFDINIR 300 MG PO CAPS: 300.0000 mg | ORAL_CAPSULE | Freq: Two times a day (BID) | ORAL | Status: AC

## 2011-11-09 NOTE — Progress Notes (Signed)
  Subjective:    Patient ID: Jeremy Ward, male    DOB: 06-14-1956, 56 y.o.   MRN: [id number]  HPI Sinuses full, dry cough, sinus pain.  Sx's for few weeks.  Tx: saline ns - temporary improvement.,  Afrin otc every night  for few weeks. Not improving and feeling tooth pain - r side cheek pain worse. No fever at home.    Review of Systems  Constitutional: Negative for fever, chills and unexpected weight change.  HENT: Positive for ear pain, congestion, dental problem and postnasal drip. Negative for hearing loss and ear discharge.        L ear, teeth hurting few months.  Respiratory: Positive for cough. Negative for chest tightness, shortness of breath and wheezing.   Cardiovascular: Negative for chest pain and leg swelling.  Skin: Negative for rash.       Objective:   Physical Exam  Constitutional: He appears well-developed and well-nourished.  HENT:  Head: Normocephalic and atraumatic.  Right Ear: Tympanic membrane, external ear and ear canal normal.  Left Ear: Tympanic membrane, external ear and ear canal normal.  Nose: Mucosal edema present. No rhinorrhea, nasal deformity or septal deviation. Right sinus exhibits maxillary sinus tenderness and frontal sinus tenderness. Left sinus exhibits maxillary sinus tenderness and frontal sinus tenderness.  Mouth/Throat: Oropharynx is clear and moist.  Eyes: Conjunctivae and EOM are normal. Pupils are equal, round, and reactive to light.  Neck: Neck supple.  Cardiovascular: Normal rate, normal heart sounds and intact distal pulses.           Assessment & Plan:   1. Sinusitis acute   2. Rhinitis medicamentosa   3. Cough    Discussed avoiding Afrin.  May need prednisone, but will try without first.  Consider glucose check if rx prednisone (unless bloodwork recently wnl.  Per pt instructions   Patient Instructions  Take Omnicef as prescribed.  Stop afrin nasal spray, can continue saline nasal spray.  If congestion/pressure not  improved in next week, may need prednisone.  Avoid decongestants with history of high blood pressure.  If blood pressure remains elevated, return to clinic or to primary care provider. Return to the clinic or go to the nearest emergency room if any of your symptoms worsen or new symptoms occur.

## 2011-11-09 NOTE — Patient Instructions (Signed)
Take Omnicef as prescribed.  Stop afrin nasal spray, can continue saline nasal spray.  If congestion/pressure not improved in next week, may need prednisone.  Avoid decongestants with history of high blood pressure.  If blood pressure remains elevated, return to clinic or to primary care provider. Return to the clinic or go to the nearest emergency room if any of your symptoms worsen or new symptoms occur.

## 2011-11-23 ENCOUNTER — Telehealth: Payer: Self-pay

## 2011-11-23 NOTE — Telephone Encounter (Signed)
Pt was given rx for coughing he has finished rx and is still coughing he would like to know what he should do and would like to see if another rx should be called in.

## 2011-11-24 MED ORDER — HYDROCOD POLST-CHLORPHEN POLST 10-8 MG/5ML PO LQCR: 5.0000 mL | Freq: Two times a day (BID) | ORAL | Status: DC

## 2011-11-24 NOTE — Telephone Encounter (Signed)
Notified pt that we can send in cough med, but need to ask Dr Neva Seat if he wants to Rx prednisone or Abx tomorrow. Pt agreed and thanked for cough med.

## 2011-11-24 NOTE — Telephone Encounter (Signed)
Pt called back again about Abx and is also requesting a cough syrup that will help him be able to sleep at night. The OTC cough med is not working. He would rather try another round or different Abx rather than prednisone. He also wanted to tell us that he is no longer at work and please call him on cell # [phone]. Routing this message back from Dr Paralee Cancel box to PA pool bc Dr Neva Seat did not address before he left office.

## 2011-11-24 NOTE — Telephone Encounter (Signed)
LMOM at H and C #s to CB. Need details of current Sxs for Dr Neva Seat.

## 2011-11-24 NOTE — Telephone Encounter (Signed)
Cough meds written signed and at TL desk.  Can not do abx without OV because of afrin use hx and abs may not do it.  Make sure pt is not using afrin.

## 2011-11-24 NOTE — Telephone Encounter (Signed)
Pt CB and reports he is still coughing (non-productive) quite a bit and is taking OTC med for it but doesn't seem to be resolving. Pt states he still has some sinus pressure also, but not having to blow nose. He didn't know whether he needs another Abx or the prednisone that was considered in OV note?

## 2011-11-25 ENCOUNTER — Ambulatory Visit (INDEPENDENT_AMBULATORY_CARE_PROVIDER_SITE_OTHER): Payer: BC Managed Care – PPO | Admitting: Physician Assistant

## 2011-11-25 DIAGNOSIS — J019 Acute sinusitis, unspecified: Secondary | ICD-10-CM

## 2011-11-25 DIAGNOSIS — J329 Chronic sinusitis, unspecified: Secondary | ICD-10-CM

## 2011-11-25 DIAGNOSIS — R05 Cough: Secondary | ICD-10-CM

## 2011-11-25 MED ORDER — IPRATROPIUM BROMIDE 0.03 % NA SOLN: 2.0000 | Freq: Three times a day (TID) | NASAL | Status: DC

## 2011-11-25 MED ORDER — GUAIFENESIN ER 1200 MG PO TB12: 1.0000 | ORAL_TABLET | Freq: Two times a day (BID) | ORAL | Status: DC

## 2011-11-25 MED ORDER — BENZONATATE 100 MG PO CAPS: ORAL_CAPSULE | ORAL | Status: AC

## 2011-11-25 MED ORDER — SULFAMETHOXAZOLE-TRIMETHOPRIM 800-160 MG PO TABS: 1.0000 | ORAL_TABLET | Freq: Two times a day (BID) | ORAL | Status: AC

## 2011-11-25 NOTE — Telephone Encounter (Signed)
Pt in office to be seen.

## 2011-11-25 NOTE — Telephone Encounter (Signed)
Call pt - needs to be seen for office visit as now with chest symptoms and persistent cough, especially as he as already completed one antibiotic course.  May need to check for other cause of cough, including secondary infection vs. Bronchospasm.

## 2011-11-25 NOTE — Progress Notes (Signed)
  Subjective:    Patient ID: Jeremy Ward, male    DOB: 1956-02-22, 56 y.o.   MRN: [id number]  HPI  Pt presents for recheck after visit 2/4.  Feels some better but still having lots of congestion and pressure in sinuses.  Now has PND and tickle in throat and cough that is keeping him up.  He is not SOB or wheezing, he has no h/o lung disease and is not a smoker.  He has minimal rhinorrhea and no sputum production.  The Tussionex that he got yesterday has helped but he is worried that the sinus pressure has not been relieved since finishing the abx.  No fever/chills.   Review of Systems  Constitutional: Negative for chills and fatigue.  HENT: Positive for congestion, rhinorrhea (has decreased his afrin use to only at night), postnasal drip and sinus pressure (still frontal and maxillary.). Negative for sore throat.   Respiratory: Positive for cough.   Gastrointestinal: Negative for nausea, vomiting and diarrhea.  Neurological: Positive for headaches.       Objective:   Physical Exam  Vitals reviewed. Constitutional: He is oriented to person, place, and time. He appears well-developed and well-nourished.  HENT:  Head: Normocephalic and atraumatic.  Right Ear: Hearing, tympanic membrane, external ear and ear canal normal.  Left Ear: Hearing, tympanic membrane, external ear and ear canal normal.  Nose: Mucosal edema present.  Mouth/Throat: Uvula is midline, oropharynx is clear and moist and mucous membranes are normal.  Neck: Normal range of motion.  Cardiovascular: Normal rate, regular rhythm and normal heart sounds.   No murmur heard. Pulmonary/Chest: Effort normal and breath sounds normal. He has no wheezes.       Coughs with deep breaths   Lymphadenopathy:    He has no cervical adenopathy.  Neurological: He is alert and oriented to person, place, and time.  Skin: Skin is warm and dry.  Psychiatric: He has a normal mood and affect. His behavior is normal. Judgment and thought  content normal.          Assessment & Plan:   1. Cough  benzonatate (TESSALON) 100 MG capsule, Guaifenesin (MUCINEX MAXIMUM STRENGTH) 1200 MG TB12  2. Sinusitis  ipratropium (ATROVENT) 0.03 % nasal spray, sulfamethoxazole-trimethoprim (BACTRIM DS,SEPTRA DS) 800-160 MG per tablet   Still ? The connection between afrin use and continued feeling of congestion.  Will try atrovent to see if pt will have decrease sinus congestion sensation, because think prednisone SE are not worth the risk vs benefit.  Will try a different abx because ? Some resistance due to long keflex treatment with hip cellulitis after hip replacement.  Pt to stop afrin use.  Push fluids.  If no better consider some type of sinus imaging.

## 2011-11-25 NOTE — Telephone Encounter (Signed)
Please contact this patient he is still feeling bad and states his chest is hurting now. Please contact him @ [phone] after 5pm @[phone]

## 2011-12-09 ENCOUNTER — Other Ambulatory Visit: Payer: Self-pay | Admitting: Physician Assistant

## 2011-12-14 ENCOUNTER — Encounter: Payer: Self-pay | Admitting: Family Medicine

## 2011-12-14 ENCOUNTER — Ambulatory Visit (INDEPENDENT_AMBULATORY_CARE_PROVIDER_SITE_OTHER): Payer: BC Managed Care – PPO | Admitting: Family Medicine

## 2011-12-14 ENCOUNTER — Ambulatory Visit
Admission: RE | Admit: 2011-12-14 | Discharge: 2011-12-14 | Disposition: A | Discharge status: Home / Self Care | Payer: BC Managed Care – PPO | Source: Ambulatory Visit | Attending: Family Medicine | Admitting: Family Medicine

## 2011-12-14 VITALS — BP 130/80 | HR 86

## 2011-12-14 DIAGNOSIS — J40 Bronchitis, not specified as acute or chronic: Secondary | ICD-10-CM

## 2011-12-14 DIAGNOSIS — R05 Cough: Secondary | ICD-10-CM

## 2011-12-14 DIAGNOSIS — R059 Cough, unspecified: Secondary | ICD-10-CM

## 2011-12-14 MED ORDER — ERYTHROMYCIN BASE 500 MG PO TABS: 500.0000 mg | ORAL_TABLET | Freq: Four times a day (QID) | ORAL | Status: AC

## 2011-12-14 MED ORDER — ALBUTEROL SULFATE HFA 108 (90 BASE) MCG/ACT IN AERS: 2.0000 | INHALATION_SPRAY | Freq: Four times a day (QID) | RESPIRATORY_TRACT | Status: DC | PRN

## 2011-12-14 NOTE — Progress Notes (Signed)
  Subjective:    Patient ID: Jeremy Ward, male    DOB: 09/05/1956, 56 y.o.   MRN: [id number]  HPI As the 5-6 week history of cough. He did visit [facility] one week into the illness and then again on February 19. He was given Keflex and then on his next visit they switched him to Septra neither of which have helped with his symptoms. He initially started out with cough, sore throat, sinus congestion. He has noted intermittent fever occurring every few days.   Review of Systems     Objective:   Physical Exam alert and in no distress. Tympanic membranes and canals are normal. Throat is clear. Tonsils are normal. Neck is supple without adenopathy or thyromegaly. Cardiac exam shows a regular sinus rhythm without murmurs or gallops. Lungs are clear to auscultation.        Assessment & Plan:   1. Bronchitis  DG Chest 2 View, erythromycin base (E-MYCIN) 500 MG tablet, CBC with Differential, Comprehensive metabolic panel  2. Cough  DG Chest 2 View, albuterol (VENTOLIN HFA) 108 (90 BASE) MCG/ACT inhaler

## 2011-12-14 NOTE — Patient Instructions (Signed)
I will call you tomorrow with the results of the blood work and x-ray

## 2011-12-15 ENCOUNTER — Telehealth: Payer: Self-pay | Admitting: Family Medicine

## 2011-12-15 LAB — CBC WITH DIFFERENTIAL/PLATELET
Basophils Absolute: 0 10*3/uL (ref 0.0–0.1)
Basophils Relative: 1 % (ref 0–1)
Eosinophils Absolute: 0.2 10*3/uL (ref 0.0–0.7)
Hemoglobin: 14.3 g/dL (ref 13.0–17.0)
MCH: 28.5 pg (ref 26.0–34.0)
MCHC: 33.6 g/dL (ref 30.0–36.0)
Monocytes Relative: 11 % (ref 3–12)
Neutro Abs: 2.7 10*3/uL (ref 1.7–7.7)
Neutrophils Relative %: 54 % (ref 43–77)
Platelets: 202 10*3/uL (ref 150–400)
RDW: 16 % — ABNORMAL HIGH (ref 11.5–15.5)

## 2011-12-15 LAB — COMPREHENSIVE METABOLIC PANEL
AST: 19 U/L (ref 0–37)
Alkaline Phosphatase: 61 U/L (ref 39–117)
BUN: 15 mg/dL (ref 6–23)
Glucose, Bld: 92 mg/dL (ref 70–99)
Potassium: 3.1 mEq/L — ABNORMAL LOW (ref 3.5–5.3)
Sodium: 142 mEq/L (ref 135–145)
Total Bilirubin: 0.4 mg/dL (ref 0.3–1.2)
Total Protein: 6.7 g/dL (ref 6.0–8.3)

## 2011-12-25 NOTE — Telephone Encounter (Signed)
TSD  

## 2012-01-28 ENCOUNTER — Encounter: Payer: Self-pay | Admitting: Family Medicine

## 2012-01-28 ENCOUNTER — Ambulatory Visit (INDEPENDENT_AMBULATORY_CARE_PROVIDER_SITE_OTHER): Payer: BC Managed Care – PPO | Admitting: Family Medicine

## 2012-01-28 VITALS — BP 140/90 | HR 86 | Temp 98.4°F | Wt 350.0 lb

## 2012-01-28 DIAGNOSIS — J019 Acute sinusitis, unspecified: Secondary | ICD-10-CM

## 2012-01-28 MED ORDER — AMOXICILLIN-POT CLAVULANATE 875-125 MG PO TABS: 1.0000 | ORAL_TABLET | Freq: Two times a day (BID) | ORAL | Status: DC

## 2012-01-28 NOTE — Progress Notes (Signed)
  Subjective:    Patient ID: Jeremy Ward, male    DOB: 1956/08/31, 56 y.o.   MRN: [id number]  HPI As a two-day history of difficulty with sinus congestion, headache, fatigue with chills, left earache. Last night he noted difficulty with left maxillary sinus pain with purulent drainage drainage mainly on left that is blood-tinged. He does not smoke. He does have underlying difficulty on occasion with asthma.  Review of Systems     Objective:   Physical Exam alert and in no distress. Tympanic membranes and canals are normal. Throat is clear. Tonsils are normal. Neck is supple without adenopathy or thyromegaly. Cardiac exam shows a regular sinus rhythm without murmurs or gallops. Lungs are clear to auscultation. Tender to palpation over left maxillary and frontal sinuses. Nasal mucosa was red.        Assessment & Plan:   1. Sinusitis acute  amoxicillin-clavulanate (AUGMENTIN) 875-125 MG per tablet   he is to call if not entirely better.

## 2012-01-28 NOTE — Patient Instructions (Signed)
Take all of the antibiotic and if not totally back to normal when you finish,call me and I will give you more.

## 2012-02-08 ENCOUNTER — Telehealth: Payer: Self-pay | Admitting: Family Medicine

## 2012-02-08 DIAGNOSIS — J019 Acute sinusitis, unspecified: Secondary | ICD-10-CM

## 2012-02-08 MED ORDER — AMOXICILLIN-POT CLAVULANATE 875-125 MG PO TABS: 1.0000 | ORAL_TABLET | Freq: Two times a day (BID) | ORAL | Status: DC

## 2012-02-08 NOTE — Telephone Encounter (Signed)
Pt called and stated that he is some better but needs another round of rx to be where he wants to be with this infection. Pt uses [facility] out pt pharmacy

## 2012-02-08 NOTE — Telephone Encounter (Signed)
Patient not over the infection, we'll continue him on Augmentin

## 2012-05-10 ENCOUNTER — Telehealth: Payer: Self-pay | Admitting: Internal Medicine

## 2012-05-10 MED ORDER — HYDROCHLOROTHIAZIDE 25 MG PO TABS: 25.0000 mg | ORAL_TABLET | Freq: Every day | ORAL | Status: DC

## 2012-05-10 NOTE — Telephone Encounter (Signed)
Sent med in 

## 2012-07-11 ENCOUNTER — Ambulatory Visit (INDEPENDENT_AMBULATORY_CARE_PROVIDER_SITE_OTHER): Payer: BC Managed Care – PPO | Admitting: Family Medicine

## 2012-07-11 ENCOUNTER — Encounter: Payer: Self-pay | Admitting: Family Medicine

## 2012-07-11 VITALS — BP 126/80 | HR 90 | Temp 99.3°F

## 2012-07-11 DIAGNOSIS — J019 Acute sinusitis, unspecified: Secondary | ICD-10-CM

## 2012-07-11 DIAGNOSIS — F430 Acute stress reaction: Secondary | ICD-10-CM

## 2012-07-11 MED ORDER — AMOXICILLIN-POT CLAVULANATE 875-125 MG PO TABS: 1.0000 | ORAL_TABLET | Freq: Two times a day (BID) | ORAL | Status: DC

## 2012-07-11 NOTE — Progress Notes (Signed)
  Subjective:    Patient ID: Jeremy Ward, male    DOB: Feb 26, 1956, 56 y.o.   MRN: [id number]  HPI He complains of a one-week history of sinus pain and right-sided pressure with right earache, dry cough, fever chills, or postnasal drainage. He also is under a lot of pressure from work related issues and would like the name of a therapist.   Review of Systems     Objective:   Physical Exam alert and in no distress. Tympanic membranes and canals are normal. Throat is clear. Tonsils are normal. Neck is supple without adenopathy or thyromegaly. Cardiac exam shows a regular sinus rhythm without murmurs or gallops. Lungs are clear to auscultation. Nasal mucosa is slightly red with tenderness especially over right maxillary sinuses       Assessment & Plan:   1. Acute sinusitis  amoxicillin-clavulanate (AUGMENTIN) 875-125 MG per tablet  2. Stress reaction     I also discussed stress and stress management. 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 I will refer him to Darryl Hyers.

## 2012-07-11 NOTE — Patient Instructions (Addendum)
If you not totally back to normal when you finish the antibiotic give me a call. Call Darryl Hyers [phone]

## 2012-07-21 ENCOUNTER — Telehealth: Payer: Self-pay | Admitting: Family Medicine

## 2012-07-21 DIAGNOSIS — J019 Acute sinusitis, unspecified: Secondary | ICD-10-CM

## 2012-07-21 MED ORDER — AMOXICILLIN-POT CLAVULANATE 875-125 MG PO TABS: 1.0000 | ORAL_TABLET | Freq: Two times a day (BID) | ORAL | Status: AC

## 2012-07-21 NOTE — Telephone Encounter (Signed)
Augmentin reorder. He is not completely over his present infection.

## 2012-07-21 NOTE — Telephone Encounter (Signed)
Let him know that I called an antibiotic in 

## 2012-07-21 NOTE — Telephone Encounter (Signed)
Pt informed

## 2012-08-08 ENCOUNTER — Ambulatory Visit (INDEPENDENT_AMBULATORY_CARE_PROVIDER_SITE_OTHER): Payer: BC Managed Care – PPO | Admitting: Family Medicine

## 2012-08-08 ENCOUNTER — Encounter: Payer: Self-pay | Admitting: Family Medicine

## 2012-08-08 VITALS — BP 120/70 | Temp 97.9°F

## 2012-08-08 DIAGNOSIS — Z23 Encounter for immunization: Secondary | ICD-10-CM

## 2012-08-08 DIAGNOSIS — R350 Frequency of micturition: Secondary | ICD-10-CM

## 2012-08-08 DIAGNOSIS — N390 Urinary tract infection, site not specified: Secondary | ICD-10-CM

## 2012-08-08 LAB — POCT URINALYSIS DIPSTICK
Bilirubin, UA: NEGATIVE
Blood, UA: NEGATIVE
Ketones, UA: NEGATIVE
Leukocytes, UA: NEGATIVE
pH, UA: 6

## 2012-08-08 MED ORDER — SULFAMETHOXAZOLE-TRIMETHOPRIM 800-160 MG PO TABS: 1.0000 | ORAL_TABLET | Freq: Two times a day (BID) | ORAL | Status: DC

## 2012-08-08 NOTE — Progress Notes (Signed)
  Subjective:    Patient ID: Jeremy Ward, male    DOB: April 18, 1956, 56 y.o.   MRN: [id number]  HPI He complains of urinary urgency, frequency, fever chills, and bladder pressure a small amount of urine. 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 He faced his second course of Augmentin sleep 10 days ago. He still has some sharp pains in his right ear as well as a nonproductive cough.   Review of Systems     Objective:   Physical Exam alert and in no distress. Tympanic membranes and canals are normal. Throat is clear. Tonsils are normal. Neck is supple without adenopathy or thyromegaly. Cardiac exam shows a regular sinus rhythm without murmurs or gallops. Lungs are clear to auscultation.       Assessment & Plan:   1. Frequency of urination  POCT Urinalysis Dipstick, sulfamethoxazole-trimethoprim (BACTRIM DS,SEPTRA DS) 800-160 MG per tablet  2. UTI (lower urinary tract infection)  sulfamethoxazole-trimethoprim (BACTRIM DS,SEPTRA DS) 800-160 MG per tablet   if no improvement, he is to return here for reevaluation. Flu shot given with risks and benefits discussed

## 2012-08-10 ENCOUNTER — Other Ambulatory Visit: Payer: Self-pay | Admitting: Family Medicine

## 2012-08-19 ENCOUNTER — Telehealth: Payer: Self-pay | Admitting: Family Medicine

## 2012-08-19 MED ORDER — FLUCONAZOLE 100 MG PO TABS: 100.0000 mg | ORAL_TABLET | Freq: Every day | ORAL | Status: DC

## 2012-08-19 NOTE — Telephone Encounter (Signed)
Diflucan called in for yeast infection

## 2012-08-19 NOTE — Telephone Encounter (Signed)
Pt is requesting medication be called in. Pt uses  out pt pharmacy.

## 2012-08-19 NOTE — Telephone Encounter (Signed)
Let him know that I called in some medication for him.

## 2012-08-19 NOTE — Telephone Encounter (Signed)
Left message on pt vm that Dr. Susann Givens sent in a rx for him that he can go pick it up

## 2012-08-22 ENCOUNTER — Telehealth: Payer: Self-pay | Admitting: Internal Medicine

## 2012-08-22 NOTE — Telephone Encounter (Signed)
[facility] called stating [facility] rx diflucan 100mg  #100 and per [facility] its 100mg  7 tablets. Notified pharmacy that its 100mg  #7 once a day

## 2012-11-09 ENCOUNTER — Other Ambulatory Visit: Payer: Self-pay | Admitting: Family Medicine

## 2013-02-07 ENCOUNTER — Other Ambulatory Visit: Payer: Self-pay | Admitting: Family Medicine

## 2013-03-14 ENCOUNTER — Telehealth: Payer: Self-pay | Admitting: Internal Medicine

## 2013-03-14 MED ORDER — FLUCONAZOLE 100 MG PO TABS: 100.0000 mg | ORAL_TABLET | Freq: Every day | ORAL | Status: DC

## 2013-03-14 NOTE — Telephone Encounter (Signed)
Pt called stating that he went to [facility] on [address] for a sinus infection the other day and gave him a form of amoxcillin and now pt has a yeast infection. Would like a medicine for that to go to [facility]

## 2013-03-14 NOTE — Telephone Encounter (Signed)
His present medication regimen as causing a yeast infection and inguinal area. He has used Diflucan in the past. I will give 2 doses of this medicine.

## 2013-08-08 ENCOUNTER — Ambulatory Visit (INDEPENDENT_AMBULATORY_CARE_PROVIDER_SITE_OTHER): Payer: BC Managed Care – PPO | Admitting: Medical

## 2013-08-08 ENCOUNTER — Encounter: Payer: Self-pay | Admitting: Medical

## 2013-08-08 VITALS — BP 120/80 | HR 72 | Temp 98.1°F | Resp 18 | Wt 322.0 lb

## 2013-08-08 DIAGNOSIS — R300 Dysuria: Secondary | ICD-10-CM

## 2013-08-08 DIAGNOSIS — M545 Low back pain, unspecified: Secondary | ICD-10-CM

## 2013-08-08 DIAGNOSIS — L299 Pruritus, unspecified: Secondary | ICD-10-CM

## 2013-08-08 DIAGNOSIS — J019 Acute sinusitis, unspecified: Secondary | ICD-10-CM

## 2013-08-08 LAB — POCT URINALYSIS DIPSTICK
Bilirubin, UA: NEGATIVE
Glucose, UA: NEGATIVE
Nitrite, UA: NEGATIVE
Spec Grav, UA: 1.01
pH, UA: 6

## 2013-08-08 MED ORDER — SULFAMETHOXAZOLE-TRIMETHOPRIM 800-160 MG PO TABS: 1.0000 | ORAL_TABLET | Freq: Two times a day (BID) | ORAL | Status: DC

## 2013-08-08 NOTE — Patient Instructions (Signed)
Begin Lamisil cream OTC for scrotum and anus.  Use this up to 3 weeks.  If not improving, then let us know.  Begin salt water soaks in the bath as well as wet wipes after defecating.  Begin Bactrim antibiotic twice daily for 10 days for both urinary and sinus infection.   Increase your water intake.    Consider daily zyrtec or allegra at bedtime for allergies.

## 2013-08-08 NOTE — Progress Notes (Signed)
Subjective:  Jeremy Ward is a 57 y.o. male who presents for multiple c/o.     Cough/cold - he has hx/o sinus infections.  Gets a few sinusitis infections yearly.  He had ear infection in August, Sinusitis in October, and now has sinus pressure, cough, head congestion, itchy eyes, sneezing for several days.   Using OTC antihistamine.  He notes hx/o UTI and prostate infection.  He has 1 day hx/o pain with urination, pelvic pressure, urinary urgency, frequency, urine a little darker than usual, chills.  Denies fever, NVD.  No blood in urine.  Also has 1 day hx/o back pain.  He notes itching of scrotum and anus for several weeks.  Hx/o constipation.  Not sure if he has had hemorrhoids.   No other c/o.  The following portions of the patient's history were reviewed and updated as appropriate: allergies, current medications, past family history, past medical history, past social history, past surgical history and problem list.  ROS Otherwise as in subjective above  Objective: Physical Exam  BP 120/80  Pulse 72  Temp(Src) 98.1 F (36.7 C) (Oral)  Resp 18  Wt 322 lb (146.058 kg)  General appearance: alert, no distress, WD/WN Abdomen: +bs, soft, non tender, non distended, no masses, no hepatomegaly, no splenomegaly Back: nontender Pulses: 2+ radial pulses Ext: no edema Declines GU and rectal exam   Assessment: Encounter Diagnoses  Name Primary?  . Dysuria Yes  . Low back pain   . Sinusitis, acute   . Pruritic condition     Plan: Begin Bactrim, increase water intake, and we will send urine for culture.   Regarding pruritis, use wet wipes for now after defecation, sitz baths, Lamisil cream OTC in groin region.  Follow up: 1-2 wk.

## 2013-08-10 LAB — URINE CULTURE: Colony Count: >=100000

## 2013-10-03 ENCOUNTER — Ambulatory Visit (INDEPENDENT_AMBULATORY_CARE_PROVIDER_SITE_OTHER): Payer: BC Managed Care – PPO | Admitting: Medical

## 2013-10-03 ENCOUNTER — Encounter: Payer: Self-pay | Admitting: Medical

## 2013-10-03 VITALS — BP 140/80 | HR 72 | Temp 97.9°F | Wt 330.0 lb

## 2013-10-03 DIAGNOSIS — R05 Cough: Secondary | ICD-10-CM

## 2013-10-03 DIAGNOSIS — R0982 Postnasal drip: Secondary | ICD-10-CM

## 2013-10-03 DIAGNOSIS — R059 Cough, unspecified: Secondary | ICD-10-CM

## 2013-10-03 DIAGNOSIS — J329 Chronic sinusitis, unspecified: Secondary | ICD-10-CM

## 2013-10-03 MED ORDER — HYDROCODONE-HOMATROPINE 5-1.5 MG/5ML PO SYRP: 5.0000 mL | ORAL_SOLUTION | Freq: Three times a day (TID) | ORAL | Status: DC | PRN

## 2013-10-03 MED ORDER — AZITHROMYCIN 250 MG PO TABS: ORAL_TABLET | ORAL | Status: DC

## 2013-10-03 NOTE — Progress Notes (Signed)
Subjective:  Jeremy Ward is a 57 y.o. male who presents for 3 day history of hacking, aggravating cough, postnasal drainage, some nasal congestion. Using Mucinex over-the-counter.  Denies fever, nausea vomiting, shortness of breath or wheezing, sinus pressure, headache.  He does have two household sick contacts with similar symptoms. No other aggravating or relieving factors.  No other c/o.  ROS as in subjective.   Objective: Filed Vitals:   10/03/13 1058  BP: 140/80  Pulse: 72  Temp: 97.9 F (36.6 C)    General appearance: Alert, WD/WN, no distress, mildly ill appearing                             Skin: warm, no rash                           Head: no sinus tenderness                            Eyes: conjunctiva normal, corneas clear, PERRLA                            Ears: pearly TMs, external ear canals normal                          Nose: septum midline, turbinates swollen, with erythema and clear discharge             Mouth/throat: MMM, tongue normal, mild pharyngeal erythema                           Neck: supple, no adenopathy, no thyromegaly, nontender                          Heart: RRR, normal S1, S2, no murmurs                         Lungs: CTA bilaterally, no wheezes, rales, or rhonchi     Assessment: Encounter Diagnoses  Name Primary?  . Cough Yes  . Post-nasal drainage      Plan: Discussed diagnosis and treatment of URI.  Suggested symptomatic OTC remedies.  Hycodan for worse cough prn.  Nasal saline spray for congestion.  Tylenol or Ibuprofen OTC for fever and malaise.  Call/return in 2-3 days if symptoms aren't resolving.  If much worse over the holiday or weekend with fever, consistent productive sputum, then begin Zpak, otherwise this appears to be viral and discussed this with patient.

## 2013-11-10 ENCOUNTER — Other Ambulatory Visit: Payer: Self-pay | Admitting: Family Medicine

## 2013-12-11 ENCOUNTER — Telehealth: Payer: Self-pay | Admitting: Family Medicine

## 2013-12-11 MED ORDER — HYDROCHLOROTHIAZIDE 25 MG PO TABS: 25.0000 mg | ORAL_TABLET | Freq: Every day | ORAL | Status: DC

## 2013-12-11 MED ORDER — VERAPAMIL HCL ER 360 MG PO CP24: 360.0000 mg | ORAL_CAPSULE | Freq: Every day | ORAL | Status: DC

## 2013-12-11 NOTE — Telephone Encounter (Signed)
Pt made a medcheck appt for later this month needs refills on HCTZ and Verapimil sent to [facility]. Pharm

## 2013-12-11 NOTE — Telephone Encounter (Signed)
Medication sent in. 

## 2013-12-28 ENCOUNTER — Encounter: Payer: Self-pay | Admitting: Family Medicine

## 2013-12-28 ENCOUNTER — Ambulatory Visit (INDEPENDENT_AMBULATORY_CARE_PROVIDER_SITE_OTHER): Payer: BC Managed Care – PPO | Admitting: Family Medicine

## 2013-12-28 VITALS — BP 110/84 | HR 80 | Wt 355.0 lb

## 2013-12-28 DIAGNOSIS — Z569 Unspecified problems related to employment: Secondary | ICD-10-CM

## 2013-12-28 DIAGNOSIS — I10 Essential (primary) hypertension: Secondary | ICD-10-CM

## 2013-12-28 DIAGNOSIS — Z79899 Other long term (current) drug therapy: Secondary | ICD-10-CM

## 2013-12-28 DIAGNOSIS — A46 Erysipelas: Secondary | ICD-10-CM

## 2013-12-28 DIAGNOSIS — Z566 Other physical and mental strain related to work: Secondary | ICD-10-CM

## 2013-12-28 LAB — COMPREHENSIVE METABOLIC PANEL
ALT: 14 U/L (ref 0–53)
AST: 17 U/L (ref 0–37)
Albumin: 4 g/dL (ref 3.5–5.2)
Alkaline Phosphatase: 49 U/L (ref 39–117)
BUN: 19 mg/dL (ref 6–23)
CALCIUM: 9.4 mg/dL (ref 8.4–10.5)
CHLORIDE: 103 meq/L (ref 96–112)
CO2: 30 mEq/L (ref 19–32)
CREATININE: 1.04 mg/dL (ref 0.50–1.35)
Glucose, Bld: 116 mg/dL — ABNORMAL HIGH (ref 70–99)
Potassium: 3 mEq/L — ABNORMAL LOW (ref 3.5–5.3)
Sodium: 141 mEq/L (ref 135–145)
Total Bilirubin: 0.5 mg/dL (ref 0.2–1.2)
Total Protein: 6.1 g/dL (ref 6.0–8.3)

## 2013-12-28 LAB — LIPID PANEL
CHOL/HDL RATIO: 3.4 ratio
Cholesterol: 166 mg/dL (ref 0–200)
HDL: 49 mg/dL (ref >39)
LDL CALC: 90 mg/dL (ref 0–99)
Triglycerides: 133 mg/dL (ref <150)
VLDL: 27 mg/dL (ref 0–40)

## 2013-12-28 LAB — CBC WITH DIFFERENTIAL/PLATELET
BASOS ABS: 0.1 10*3/uL (ref 0.0–0.1)
BASOS PCT: 1 % (ref 0–1)
EOS PCT: 3 % (ref 0–5)
Eosinophils Absolute: 0.2 10*3/uL (ref 0.0–0.7)
HCT: 41.3 % (ref 39.0–52.0)
Hemoglobin: 14.5 g/dL (ref 13.0–17.0)
LYMPHS PCT: 25 % (ref 12–46)
Lymphs Abs: 1.3 10*3/uL (ref 0.7–4.0)
MCH: 29.5 pg (ref 26.0–34.0)
MCHC: 35.1 g/dL (ref 30.0–36.0)
MCV: 83.9 fL (ref 78.0–100.0)
Monocytes Absolute: 0.5 10*3/uL (ref 0.1–1.0)
Monocytes Relative: 9 % (ref 3–12)
Neutro Abs: 3.3 10*3/uL (ref 1.7–7.7)
Neutrophils Relative %: 62 % (ref 43–77)
PLATELETS: 187 10*3/uL (ref 150–400)
RBC: 4.92 MIL/uL (ref 4.22–5.81)
RDW: 15.2 % (ref 11.5–15.5)
WBC: 5.3 10*3/uL (ref 4.0–10.5)

## 2013-12-28 MED ORDER — VERAPAMIL HCL ER 360 MG PO CP24: 360.0000 mg | ORAL_CAPSULE | Freq: Every day | ORAL | Status: DC

## 2013-12-28 MED ORDER — HYDROCHLOROTHIAZIDE 25 MG PO TABS: 25.0000 mg | ORAL_TABLET | Freq: Every day | ORAL | Status: DC

## 2013-12-28 MED ORDER — CLARITHROMYCIN 500 MG PO TABS
500.0000 mg | ORAL_TABLET | Freq: Two times a day (BID) | ORAL | Status: DC
Start: 2013-12-28 — End: 2014-04-03

## 2013-12-28 NOTE — Patient Instructions (Addendum)
Use Lamisil for at least 2 weeks and the antibiotic Call Darryl Hyers. [phone]

## 2013-12-28 NOTE — Progress Notes (Signed)
   Subjective:    Patient ID: Jeremy Ward, male    DOB: 1956-05-04, 58 y.o.   MRN: [id number]  HPI He is here for medication check. He continues on his blood pressure medications and is having no difficulty with that. His weight is essentially unchanged. He has had some difficulty with a rash present mainly on the right inguinal area and sometimes in the perianal/scrotal area. He also has been under a lot of work related stress. 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 Otherwise he has no concerns or complaints. His marriage is going well. Does not smoke or drink and gets no exercise.   Review of Systems     Objective:   Physical Exam alert and in no distress. Tympanic membranes and canals are normal. Throat is clear. Tonsils are normal. Neck is supple without adenopathy or thyromegaly. Cardiac exam shows a regular sinus rhythm without murmurs or gallops. Lungs are clear to auscultation. Exam of the right inguinal area does show some excoriation and erythema to the entire area. It is approximately 5 or 6 cm wide and 8-10 long.       Assessment & Plan:  Hypertension - Plan: CBC with Differential, Comprehensive metabolic panel, verapamil (VERELAN PM) 360 MG 24 hr capsule, hydrochlorothiazide (HYDRODIURIL) 25 MG tablet  Morbid obesity - Plan: CBC with Differential, Comprehensive metabolic panel, Lipid panel  Erysipelas  Encounter for long-term (current) use of other medications - Plan: CBC with Differential, Comprehensive metabolic panel, Lipid panel  Work-related stress  recommend he use an antifungal as well as the antibiotic for at least the next 10 days. We also discussed work-related stress. I will refer him to [facility] for further counseling. Did not discuss weight control.

## 2014-01-11 ENCOUNTER — Telehealth: Payer: Self-pay | Admitting: Family Medicine

## 2014-01-11 ENCOUNTER — Other Ambulatory Visit (INDEPENDENT_AMBULATORY_CARE_PROVIDER_SITE_OTHER): Payer: BC Managed Care – PPO

## 2014-01-11 DIAGNOSIS — Z0000 Encounter for general adult medical examination without abnormal findings: Secondary | ICD-10-CM

## 2014-01-11 LAB — HEMOCCULT GUIAC POC 1CARD (OFFICE)
Card #2 Fecal Occult Blod, POC: NEGATIVE
Card #3 Fecal Occult Blood, POC: NEGATIVE
FECAL OCCULT BLD: NEGATIVE

## 2014-01-11 NOTE — Telephone Encounter (Signed)
Lets send him to a dermatologist.

## 2014-01-12 ENCOUNTER — Telehealth: Payer: Self-pay | Admitting: Family Medicine

## 2014-01-12 ENCOUNTER — Other Ambulatory Visit: Payer: BC Managed Care – PPO

## 2014-01-12 NOTE — Telephone Encounter (Signed)
lm

## 2014-01-12 NOTE — Telephone Encounter (Signed)
Left message for pt to advise JCL wants him to see a Dermatologist & see if he needs referral or if he wants to schedule on his own

## 2014-04-03 ENCOUNTER — Ambulatory Visit (INDEPENDENT_AMBULATORY_CARE_PROVIDER_SITE_OTHER): Payer: BC Managed Care – PPO | Admitting: Family Medicine

## 2014-04-03 ENCOUNTER — Encounter: Payer: Self-pay | Admitting: Family Medicine

## 2014-04-03 ENCOUNTER — Ambulatory Visit
Admission: RE | Admit: 2014-04-03 | Discharge: 2014-04-03 | Disposition: A | Discharge status: Home / Self Care | Payer: BC Managed Care – PPO | Source: Ambulatory Visit | Attending: Family Medicine | Admitting: Family Medicine

## 2014-04-03 VITALS — BP 140/90 | HR 77 | Temp 98.1°F

## 2014-04-03 DIAGNOSIS — M25569 Pain in unspecified knee: Secondary | ICD-10-CM

## 2014-04-03 DIAGNOSIS — M25562 Pain in left knee: Principal | ICD-10-CM

## 2014-04-03 DIAGNOSIS — M25561 Pain in right knee: Secondary | ICD-10-CM

## 2014-04-03 NOTE — Patient Instructions (Addendum)
The Mobic and switch to 4 Advil 3 times per day. Call  Ed Lurey 373 [phone]

## 2014-04-03 NOTE — Progress Notes (Signed)
   Subjective:    Patient ID: Jeremy Ward, male    DOB: 1956/04/20, 58 y.o.   MRN: [id number]  HPI He is here for evaluation of bilateral knee pain. This started roughly 2 weeks ago. His physical activity pattern has not changed. He has had difficulty with hip pain in the past and has seen orthopedics for this. Presently he is on Mobic for generalized aches and pains and arthralgias. He continues to struggle with his weight.   Review of Systems     Objective:   Physical Exam Exam of both knees shows no effusion with no crepitus. No laxity noted. Good motion.       Assessment & Plan:  Bilateral knee pain - Plan: DG Knee 1-2 Views Left, DG Knee 1-2 Views Right  Morbid obesity  discussed the possibility of this being arthritis related and the benefit of weight loss. He recognizes this. He also recognizes a psychological component to his eating habits. Discussed referral to counseling. Recommend he see Altha Harm

## 2014-04-09 ENCOUNTER — Telehealth: Payer: Self-pay | Admitting: Family Medicine

## 2014-04-09 MED ORDER — DICLOFENAC SODIUM 75 MG PO TBEC: 75.0000 mg | DELAYED_RELEASE_TABLET | Freq: Two times a day (BID) | ORAL | Status: DC

## 2014-04-09 NOTE — Telephone Encounter (Signed)
Let him know that I called a different medication in. If this does not work have him call me back in one week

## 2014-04-09 NOTE — Telephone Encounter (Signed)
PT IS AWARE

## 2014-04-09 NOTE — Telephone Encounter (Signed)
Pt called and stated he has been taking the advil that you told him to take but the pain is still too much. Pt is requesting something be called in for him at least to help him with the pain so he can sleep. Pt uses [facility]. Please call pt and let him know. Pt can be reached at work [phone]

## 2014-04-09 NOTE — Telephone Encounter (Signed)
I will try Voltaren for pain relief and if he continues to have difficulty, he is to call me back.

## 2014-04-20 ENCOUNTER — Encounter: Payer: Self-pay | Admitting: Medical

## 2014-04-20 ENCOUNTER — Ambulatory Visit (INDEPENDENT_AMBULATORY_CARE_PROVIDER_SITE_OTHER): Payer: BC Managed Care – PPO | Admitting: Medical

## 2014-04-20 VITALS — BP 150/80 | HR 60 | Temp 97.6°F | Resp 18 | Wt 340.0 lb

## 2014-04-20 DIAGNOSIS — R350 Frequency of micturition: Secondary | ICD-10-CM

## 2014-04-20 LAB — POCT URINALYSIS DIPSTICK
Bilirubin, UA: NEGATIVE
Glucose, UA: NEGATIVE
KETONES UA: NEGATIVE
Leukocytes, UA: NEGATIVE
Nitrite, UA: NEGATIVE
PH UA: 6
Protein, UA: NEGATIVE
RBC UA: NEGATIVE
SPEC GRAV UA: 1.015
UROBILINOGEN UA: NEGATIVE

## 2014-04-20 LAB — GLUCOSE, POCT (MANUAL RESULT ENTRY): POC Glucose: 108 mg/dl — AB (ref 70–99)

## 2014-04-20 MED ORDER — SULFAMETHOXAZOLE-TMP DS 800-160 MG PO TABS: 1.0000 | ORAL_TABLET | Freq: Two times a day (BID) | ORAL | Status: DC

## 2014-04-20 NOTE — Progress Notes (Signed)
   Subjective:    Patient ID: Jeremy Ward, male    DOB: 21-Nov-1955, 58 y.o.   MRN: [id number]  HPI  Patient is presenting for urinary frequency, urgency and general malaise for the last week. He is on a diuretic HCT 25mg  QD, usually urinates 3-4 times a day and at most twice a night. Now he is having to urinate about once every hour, having to go multiple times a night as well x 1 week. He tried taking Tylenol PM to help him stay asleep but still had to wake up to urinate at night. Associated symptoms include dark yellow urine, strong smelling urine, nausea, low back pain (which is not new). Reports a good flow generally but at night has to wait a little bit longer, feels like he is able to empty his bladder. Denies fevers, dysuria, hematuria, incontinence cloudy urine, abdominal pain, vomiting, flank pain, history of kidney stones, pyelonephritis. Reports a history of UTI, prostatitis (x2) for which he had to take antibiotics. Of note, urine culture in 08/2013, resulted with Klebsiella pneumonia. No other aggravating or relieving factors.  Review of Systems As in subjective.    Objective:   Physical Exam  BP 150/80  Pulse 60  Temp(Src) 97.6 F (36.4 C) (Oral)  Resp 18  Wt 340 lb (154.223 kg)  Wt Readings from Last 3 Encounters:  04/20/14 340 lb (154.223 kg)  12/28/13 355 lb (161.027 kg)  10/03/13 330 lb (149.687 kg)   General appearance: alert, no distress, WD/WN, morbidly obese white male Abdomen: +bs, soft, non tender, non distended, no masses, no hepatomegaly, no splenomegaly  Back: nontender  Pulses: 1+ posterior tibial pulses  Ext: 1+ pitting edema up to the knee Declined GU and rectal exam.      Assessment & Plan:   Encounter Diagnosis  Name Primary?  . Urinary frequency Yes   Discussed possible causes of urinary frquency.  although urinalysis is normal today, he was positive for Klebsiella on urine culture in November, has a history of urinary tract infection  and prostatitis.  Random glucose today 108.  Empirically start Bactrim, urine culture sent.  Advised to increase water intake, urine cultures pending, return to here if symptoms worsen including fevers, flank pain, hematuria.  Patient was seen in conjunction with PA student Jaynee Eagles, and I have also evaluated and examined patient, agree with student's notes, student supervised by me.

## 2014-04-23 LAB — URINE CULTURE

## 2014-04-26 ENCOUNTER — Telehealth: Payer: Self-pay | Admitting: Family Medicine

## 2014-04-26 ENCOUNTER — Other Ambulatory Visit: Payer: Self-pay | Admitting: Medical

## 2014-04-26 MED ORDER — DICLOFENAC SODIUM 75 MG PO TBEC: 75.0000 mg | DELAYED_RELEASE_TABLET | Freq: Two times a day (BID) | ORAL | Status: DC

## 2014-04-26 MED ORDER — SULFAMETHOXAZOLE-TMP DS 800-160 MG PO TABS: 1.0000 | ORAL_TABLET | Freq: Two times a day (BID) | ORAL | Status: DC

## 2014-04-26 NOTE — Telephone Encounter (Signed)
Left message to call back  

## 2014-04-26 NOTE — Telephone Encounter (Signed)
I refilled the Voltaren, I refilled the antibiotic actually for a two-week supply in case we need to go that long for suspected prostate infection as well

## 2014-04-26 NOTE — Telephone Encounter (Signed)
Pt states he came in and was given a rx for voltaren. He also was sent to his ortho. His ortho told him his knees were noy completely shot and to stay on the voltaren. He states that he has been on the medication and is still hurting. He has a follow up with ortho but in a month. He is requesting something for pain to help him get thru until that appt. He also states he saw shane and was give medication for a prostate infection. He finished it today. He states symptom are not any better. He is wanting to know what to do now about that.

## 2014-04-27 ENCOUNTER — Other Ambulatory Visit: Payer: Self-pay | Admitting: Medical

## 2014-04-27 MED ORDER — CIPROFLOXACIN HCL 500 MG PO TABS: 500.0000 mg | ORAL_TABLET | Freq: Two times a day (BID) | ORAL | Status: DC

## 2014-04-27 NOTE — Telephone Encounter (Signed)
Pt states that the bactrim is making him dry and making things worse. He would like something else called in [facility]

## 2014-04-27 NOTE — Telephone Encounter (Signed)
Patient is aware of the change in the medication. CLS

## 2014-04-27 NOTE — Telephone Encounter (Signed)
Stop Bactrim, begin Cipro antibiotic

## 2014-05-14 ENCOUNTER — Telehealth: Payer: Self-pay | Admitting: Family Medicine

## 2014-05-14 ENCOUNTER — Other Ambulatory Visit: Payer: Self-pay | Admitting: Medical

## 2014-05-14 MED ORDER — CIPROFLOXACIN HCL 500 MG PO TABS: 500.0000 mg | ORAL_TABLET | Freq: Two times a day (BID) | ORAL | Status: DC

## 2014-05-14 NOTE — Telephone Encounter (Signed)
Go a little longer on Cipro, restart and complete this round.  If not cleared up by end of the antibiotic, f/u with Dr. Redmond School

## 2014-05-14 NOTE — Telephone Encounter (Signed)
Patient is aware of Jeremy Ward message. CLS

## 2014-05-14 NOTE — Telephone Encounter (Signed)
Pt called and states as soon as he ran out of rx that his sx came right back.  Going to the bathroom 30 min - 1 hour.  Please send another refill or something stronger to [facility].  Pt ph [phone]

## 2014-06-20 ENCOUNTER — Ambulatory Visit (INDEPENDENT_AMBULATORY_CARE_PROVIDER_SITE_OTHER): Payer: BC Managed Care – PPO | Admitting: Family Medicine

## 2014-06-20 VITALS — BP 120/80 | HR 70 | Wt 347.0 lb

## 2014-06-20 DIAGNOSIS — Z23 Encounter for immunization: Secondary | ICD-10-CM

## 2014-06-20 DIAGNOSIS — R350 Frequency of micturition: Secondary | ICD-10-CM

## 2014-06-20 LAB — POCT URINALYSIS DIPSTICK
BILIRUBIN UA: NEGATIVE
Blood, UA: NEGATIVE
GLUCOSE UA: NEGATIVE
KETONES UA: NEGATIVE
LEUKOCYTES UA: NEGATIVE
Nitrite, UA: NEGATIVE
PROTEIN UA: NEGATIVE
Spec Grav, UA: 1.02
Urobilinogen, UA: NEGATIVE
pH, UA: 6

## 2014-06-20 NOTE — Progress Notes (Signed)
   Subjective:    Patient ID: Jeremy Ward, male    DOB: 09/29/1956, 58 y.o.   MRN: [id number]  HPI He is here for consultation concerning continued difficulty with urinary frequency. He states that during the day he urinates every 3 or 4 hours however at night he does every 2 hours. He does have urgency but no hesitancy or decreased stream. He has had no incontinence, back pain, fever or chills. He did have a recent urine culture which was positive. He recently finished antibiotics and states his symptoms have reoccurred.   Review of Systems     Objective:   Physical Exam Alert and in no distress. Urine dipstick is negative.       Assessment & Plan:  Urinary frequency - Plan: POCT Urinalysis Dipstick, CULTURE, URINE COMPREHENSIVE  I will repeat the culture to be safe. Also encouraged him to cut back on his fluids especially later on in the day and make sure he does empty his bladder. Instructed him to make sure his urine is slightly yellow and therefore on the dry side. If this does not work in the culture comes out negative, urologic evaluation will probably be needed.

## 2014-06-22 ENCOUNTER — Other Ambulatory Visit: Payer: Self-pay | Admitting: Medical

## 2014-06-22 ENCOUNTER — Telehealth: Payer: Self-pay | Admitting: Family Medicine

## 2014-06-22 LAB — CULTURE, URINE COMPREHENSIVE

## 2014-06-22 MED ORDER — CEPHALEXIN 500 MG PO CAPS: 500.0000 mg | ORAL_CAPSULE | Freq: Two times a day (BID) | ORAL | Status: DC

## 2014-06-22 NOTE — Telephone Encounter (Signed)
antibiotic Keflex sent, but since he has had so many frequent recent urinary issues, I recommend we refer to Urology.

## 2014-06-22 NOTE — Telephone Encounter (Signed)
Left message on pt VM that med was sent out and to call back if he would like a referral to Urology

## 2014-06-22 NOTE — Telephone Encounter (Signed)
Please call re culture results

## 2014-06-23 ENCOUNTER — Emergency Department (HOSPITAL_COMMUNITY)
Admission: EM | Admit: 2014-06-23 | Discharge: 2014-06-23 | Disposition: A | Discharge status: Home / Self Care | Payer: BC Managed Care – PPO | Attending: Emergency Medicine | Admitting: Emergency Medicine

## 2014-06-23 ENCOUNTER — Emergency Department (HOSPITAL_COMMUNITY): Payer: BC Managed Care – PPO

## 2014-06-23 ENCOUNTER — Encounter (HOSPITAL_COMMUNITY): Payer: Self-pay | Admitting: Emergency Medicine

## 2014-06-23 DIAGNOSIS — M25559 Pain in unspecified hip: Secondary | ICD-10-CM | POA: Diagnosis present

## 2014-06-23 DIAGNOSIS — Z96649 Presence of unspecified artificial hip joint: Secondary | ICD-10-CM | POA: Insufficient documentation

## 2014-06-23 DIAGNOSIS — Z791 Long term (current) use of non-steroidal anti-inflammatories (NSAID): Secondary | ICD-10-CM | POA: Diagnosis not present

## 2014-06-23 DIAGNOSIS — Z9889 Other specified postprocedural states: Secondary | ICD-10-CM | POA: Insufficient documentation

## 2014-06-23 DIAGNOSIS — Z79899 Other long term (current) drug therapy: Secondary | ICD-10-CM | POA: Diagnosis not present

## 2014-06-23 DIAGNOSIS — Z8719 Personal history of other diseases of the digestive system: Secondary | ICD-10-CM | POA: Diagnosis not present

## 2014-06-23 DIAGNOSIS — Z792 Long term (current) use of antibiotics: Secondary | ICD-10-CM | POA: Diagnosis not present

## 2014-06-23 DIAGNOSIS — I10 Essential (primary) hypertension: Secondary | ICD-10-CM | POA: Diagnosis not present

## 2014-06-23 DIAGNOSIS — M25551 Pain in right hip: Secondary | ICD-10-CM

## 2014-06-23 DIAGNOSIS — IMO0002 Reserved for concepts with insufficient information to code with codable children: Secondary | ICD-10-CM | POA: Insufficient documentation

## 2014-06-23 DIAGNOSIS — Z8739 Personal history of other diseases of the musculoskeletal system and connective tissue: Secondary | ICD-10-CM | POA: Insufficient documentation

## 2014-06-23 MED ORDER — HYDROCODONE-ACETAMINOPHEN 5-325 MG PO TABS
2.0000 | ORAL_TABLET | Freq: Once | ORAL | Status: AC
  Administered 2014-06-23: 2 via ORAL
  Filled 2014-06-23: qty 2

## 2014-06-23 MED ORDER — KETOROLAC TROMETHAMINE 30 MG/ML IJ SOLN
30.0000 mg | Freq: Once | INTRAMUSCULAR | Status: AC
  Administered 2014-06-23: 30 mg via INTRAMUSCULAR
  Filled 2014-06-23: qty 1

## 2014-06-23 MED ORDER — METHYLPREDNISOLONE ACETATE 80 MG/ML IJ SUSP
80.0000 mg | Freq: Once | INTRAMUSCULAR | Status: AC
  Administered 2014-06-23: 80 mg via INTRAMUSCULAR
  Filled 2014-06-23: qty 1

## 2014-06-23 MED ORDER — TRAMADOL HCL 50 MG PO TABS: 100.0000 mg | ORAL_TABLET | Freq: Four times a day (QID) | ORAL | Status: DC | PRN

## 2014-06-23 NOTE — ED Notes (Signed)
Pt reports R hip pain that got worse last night. Hx of having that hip replaced 2x before. Able to bear weight on hip, but hurts. Pt ambulatory with cane. Also began taking keflex for UTI last night.

## 2014-06-23 NOTE — ED Provider Notes (Signed)
CSN: [id number]     Arrival date & time 06/23/14  2353 History   First MD Initiated Contact with Patient 06/23/14 1014     Chief Complaint  Patient presents with  . Hip Pain     (Consider location/radiation/quality/duration/timing/severity/associated sxs/prior Treatment) HPI Comments: Patient complains of right hip pain that is gradually worsening over the past week.  He has a history of right hip replacement x2; last replacement 3 years ago.  He denies any recent falls or trauma, but admits to right hip pain upon exiting the shower a week ago.  Since then he has had gradually worsening of his hip pain.  Pain is made worse by weight bearing and lying on the affected.  He has taken Tylenol and diclofenac which have helped but not relieved his pain.  Denies any fevers, chills, or hip erythema or swelling.  He has been able to bear weight and walk but reports this is very painful.   Patient is a 58 y.o. male presenting with hip pain.  Hip Pain This is a new problem. The current episode started in the past 7 days. The problem occurs constantly. The problem has been gradually worsening. Associated symptoms include arthralgias and myalgias. Pertinent negatives include no chills, diaphoresis, fatigue, fever, joint swelling, nausea, numbness or weakness. The symptoms are aggravated by standing and walking. He has tried acetaminophen and NSAIDs for the symptoms. The treatment provided mild relief.    Past Medical History  Diagnosis Date  . Allergy     RHINITIS  . Hypertension   . Obesity, morbid (more than 100 lbs over ideal weight or BMI > 40)   . Arthritis     HIP  . Diverticulosis    Past Surgical History  Procedure Laterality Date  . Tonsillectomy and adenoidectomy  1963  . Carpal tunnel release    . Joint replacement      RIGHT HIP  . Eye surgery  1964   Family History  Problem Relation Age of Onset  . Heart disease Mother   . Arthritis Mother   . Heart disease Father    History   Substance Use Topics  . Smoking status: Never Smoker   . Smokeless tobacco: Never Used  . Alcohol Use: No    Review of Systems  Constitutional: Negative for fever, chills, diaphoresis and fatigue.  Respiratory: Negative.   Cardiovascular: Negative.   Gastrointestinal: Negative for nausea.  Musculoskeletal: Positive for arthralgias and myalgias. Negative for joint swelling.  Neurological: Negative for weakness and numbness.      Allergies  Review of patient's allergies indicates no known allergies.  Home Medications   Prior to Admission medications   Medication Sig Start Date End Date Taking? Authorizing Provider  acetaminophen (TYLENOL) 500 MG tablet Take 1,000 mg by mouth every morning.   Yes Historical Provider, MD  cephALEXin (KEFLEX) 500 MG capsule Take 1 capsule (500 mg total) by mouth 2 (two) times daily. 06/22/14  Yes Camelia Eng Tysinger, PA-C  Cholecalciferol (VITAMIN D-3) 5000 UNITS TABS Take by mouth.   Yes Historical Provider, MD  diclofenac (VOLTAREN) 75 MG EC tablet Take 1 tablet (75 mg total) by mouth 2 (two) times daily. 04/26/14  Yes Camelia Eng Tysinger, PA-C  hydrochlorothiazide (HYDRODIURIL) 25 MG tablet Take 1 tablet (25 mg total) by mouth daily. 12/28/13  Yes Denita Lung, MD  Potassium (POTASSIMIN PO) Take 2 tablets by mouth daily.   Yes Historical Provider, MD  triamcinolone (NASACORT ALLERGY 24HR) 55 MCG/ACT AERO  nasal inhaler Place 2 sprays into the nose daily.   Yes Historical Provider, MD  verapamil (VERELAN PM) 360 MG 24 hr capsule Take 360 mg by mouth daily.   Yes Historical Provider, MD  traMADol (ULTRAM) 50 MG tablet Take 2 tablets (100 mg total) by mouth every 6 (six) hours as needed. 06/23/14   Olam Idler, MD   BP 152/67  Pulse 62  Temp(Src) 98 F (36.7 C) (Oral)  Resp 16  SpO2 95% Physical Exam  Constitutional: He is oriented to person, place, and time.  Morbidly obese  Cardiovascular: Normal rate, regular rhythm and normal heart sounds.    Pulmonary/Chest: Effort normal and breath sounds normal. No respiratory distress.  Abdominal: Soft. There is tenderness.  Musculoskeletal:  Right hip: No erythema or swelling; tenderness to palpation along posterior hip / gluteus. ROM limited by obesity; Strength 4/5 for hip flexion, extension, and abduction.     Neurological: He is alert and oriented to person, place, and time.    ED Course  Procedures (including critical care time) Labs Review Labs Reviewed - No data to display  Imaging Review Dg Hip Complete Right  06/23/2014   CLINICAL DATA:  Right hip pain.  No reported injury.  EXAM: RIGHT HIP - COMPLETE 2+ VIEW  COMPARISON:  06/22/2011.  FINDINGS: Right total hip prosthesis in satisfactory position and alignment. One of the acetabular screws projects slightly into the lateral aspect of the right pelvis. No fracture or dislocation seen.  IMPRESSION: No acute abnormality.   Electronically Signed   By: Enrique Sack M.D.   On: 06/23/2014 11:46     EKG Interpretation None      MDM   Final diagnoses:  Right hip pain   Patient comes in with posterior right hip pain for 1 week.  Status post 2 right hip replacements last one 3 years ago.  Patient reports recent proctitis currently on antibiotics.  Denies any fevers, chills, right hip, erythema, or swelling; afebrile and vital signs stable without signs of hip infection.  Hip x-rays that should he fractures or hardware malalignment.  Patient's pain improved with Depo-Medrol 80mg  & Toradol 30mg  IM. Rx given for Ultram 50mg  # 30 and advised to follow-up with his orthopedic surgeon.      Olam Idler, MD 06/23/14 1322

## 2014-06-23 NOTE — Discharge Instructions (Signed)
Hip Pain Your hip is the joint between your upper legs and your lower pelvis. The bones, cartilage, tendons, and muscles of your hip joint perform a lot of work each day supporting your body weight and allowing you to move around. Hip pain can range from a minor ache to severe pain in one or both of your hips. Pain may be felt on the inside of the hip joint near the groin, or the outside near the buttocks and upper thigh. You may have swelling or stiffness as well.  HOME CARE INSTRUCTIONS   Take medicines only as directed by your health care provider.  Apply ice to the injured area:  Put ice in a plastic bag.  Place a towel between your skin and the bag.  Leave the ice on for 15-20 minutes at a time, 3-4 times a day.  Keep your leg raised (elevated) when possible to lessen swelling.  Avoid activities that cause pain.  Follow specific exercises as directed by your health care provider.  Sleep with a pillow between your legs on your most comfortable side.  Record how often you have hip pain, the location of the pain, and what it feels like. SEEK MEDICAL CARE IF:   You are unable to put weight on your leg.  Your hip is red or swollen or very tender to touch.  Your pain or swelling continues or worsens after 1 week.  You have increasing difficulty walking.  You have a fever. SEEK IMMEDIATE MEDICAL CARE IF:   You have fallen.  You have a sudden increase in pain and swelling in your hip. MAKE SURE YOU:   Understand these instructions.  Will watch your condition.  Will get help right away if you are not doing well or get worse. Document Released: 03/11/2010 Document Revised: 02/05/2014 Document Reviewed: 05/18/2013 Exit[employer] Patient Information 2015 [employer]. This information is not intended to replace advice given to you by your health care provider. Make sure you discuss any questions you have with your health care provider.  

## 2014-06-24 NOTE — ED Provider Notes (Signed)
I saw and evaluated the patient, reviewed the resident's note and I agree with the findings and plan.   .Face to face Exam:  General:  Awake HEENT:  Atraumatic Resp:  Normal effort Abd:  Nondistended Neuro:No focal weakness  Dot Lanes, MD 06/24/14 [phone]

## 2014-07-05 ENCOUNTER — Other Ambulatory Visit: Payer: Self-pay | Admitting: Medical

## 2014-07-05 MED ORDER — CEPHALEXIN 500 MG PO CAPS: 500.0000 mg | ORAL_CAPSULE | Freq: Two times a day (BID) | ORAL | Status: DC

## 2014-08-23 ENCOUNTER — Ambulatory Visit (INDEPENDENT_AMBULATORY_CARE_PROVIDER_SITE_OTHER): Payer: BC Managed Care – PPO | Admitting: Family Medicine

## 2014-08-23 ENCOUNTER — Encounter: Payer: Self-pay | Admitting: Family Medicine

## 2014-08-23 VITALS — BP 130/80 | HR 112 | Wt 350.0 lb

## 2014-08-23 DIAGNOSIS — R350 Frequency of micturition: Secondary | ICD-10-CM

## 2014-08-23 DIAGNOSIS — N3281 Overactive bladder: Secondary | ICD-10-CM

## 2014-08-23 DIAGNOSIS — R5383 Other fatigue: Secondary | ICD-10-CM

## 2014-08-23 LAB — POCT URINALYSIS DIPSTICK
Bilirubin, UA: NEGATIVE
Blood, UA: NEGATIVE
Glucose, UA: NEGATIVE
Ketones, UA: NEGATIVE
Leukocytes, UA: NEGATIVE
NITRITE UA: NEGATIVE
Protein, UA: NEGATIVE
SPEC GRAV UA: 1.02
UROBILINOGEN UA: NEGATIVE
pH, UA: 6

## 2014-08-23 LAB — TSH: TSH: 3.936 u[IU]/mL (ref 0.350–4.500)

## 2014-08-23 NOTE — Progress Notes (Signed)
   Subjective:    Patient ID: VAUGHAN GARFINKLE, male    DOB: 1956/08/13, 58 y.o.   MRN: [id number]  HPI He is here for consultation. He continues to have difficulty with bladder emptying. He is presently being followed by urology. He did try Flomax for approximately 3 weeks with minimal benefit. He then tried [employer] and found that it caused too much drying. Recently he did try both Flomax and Vesicare but is only use this for one day. He does empty his bladder at night. He states that his urinary frequency during the day is appropriate and he has regular flow however at night he will have the urge but minimal urinary output.   Review of Systems     Objective:   Physical Exam alert and in no distress otherwise not examined      Assessment & Plan:  Urination frequency - Plan: POCT Urinalysis Dipstick  Other fatigue - Plan: TSH  OAB (overactive bladder)  I discussed options with him. I explained that trying the combination for a longer period of time might be worthwhile. Also recommended trying Azo-Standard. I doubt that it'll work but I think it's worth trying. Also mentioned the possibility of botulinum toxin to him.

## 2014-11-09 ENCOUNTER — Ambulatory Visit (INDEPENDENT_AMBULATORY_CARE_PROVIDER_SITE_OTHER): Payer: BC Managed Care – PPO | Admitting: Medical

## 2014-11-09 ENCOUNTER — Encounter: Payer: Self-pay | Admitting: Medical

## 2014-11-09 VITALS — BP 130/70 | HR 88 | Temp 98.1°F | Resp 16 | Wt 340.0 lb

## 2014-11-09 DIAGNOSIS — L03115 Cellulitis of right lower limb: Secondary | ICD-10-CM

## 2014-11-09 DIAGNOSIS — R6883 Chills (without fever): Secondary | ICD-10-CM | POA: Diagnosis not present

## 2014-11-09 DIAGNOSIS — I8390 Asymptomatic varicose veins of unspecified lower extremity: Secondary | ICD-10-CM

## 2014-11-09 DIAGNOSIS — I868 Varicose veins of other specified sites: Secondary | ICD-10-CM

## 2014-11-09 DIAGNOSIS — R110 Nausea: Secondary | ICD-10-CM

## 2014-11-09 DIAGNOSIS — I872 Venous insufficiency (chronic) (peripheral): Secondary | ICD-10-CM | POA: Diagnosis not present

## 2014-11-09 LAB — BASIC METABOLIC PANEL
BUN: 18 mg/dL (ref 6–23)
CO2: 32 mEq/L (ref 19–32)
Calcium: 8.8 mg/dL (ref 8.4–10.5)
Chloride: 101 mEq/L (ref 96–112)
Creat: 1.07 mg/dL (ref 0.50–1.35)
Glucose, Bld: 116 mg/dL — ABNORMAL HIGH (ref 70–99)
Potassium: 2.7 mEq/L — CL (ref 3.5–5.3)
Sodium: 142 mEq/L (ref 135–145)

## 2014-11-09 LAB — CBC WITH DIFFERENTIAL/PLATELET
Basophils Absolute: 0 10*3/uL (ref 0.0–0.1)
Basophils Relative: 0 % (ref 0–1)
Eosinophils Absolute: 0.2 10*3/uL (ref 0.0–0.7)
Eosinophils Relative: 3 % (ref 0–5)
HCT: 41.3 % (ref 39.0–52.0)
Hemoglobin: 14 g/dL (ref 13.0–17.0)
Lymphocytes Relative: 22 % (ref 12–46)
Lymphs Abs: 1.1 10*3/uL (ref 0.7–4.0)
MCH: 29.9 pg (ref 26.0–34.0)
MCHC: 33.9 g/dL (ref 30.0–36.0)
MCV: 88.1 fL (ref 78.0–100.0)
MPV: 9.7 fL (ref 8.6–12.4)
Monocytes Absolute: 0.5 10*3/uL (ref 0.1–1.0)
Monocytes Relative: 9 % (ref 3–12)
Neutro Abs: 3.4 10*3/uL (ref 1.7–7.7)
Neutrophils Relative %: 66 % (ref 43–77)
Platelets: 176 10*3/uL (ref 150–400)
RBC: 4.69 MIL/uL (ref 4.22–5.81)
RDW: 14.9 % (ref 11.5–15.5)
WBC: 5.2 10*3/uL (ref 4.0–10.5)

## 2014-11-09 LAB — HEMOGLOBIN A1C
Hgb A1c MFr Bld: 5.6 % (ref <5.7)
Mean Plasma Glucose: 114 mg/dL (ref <117)

## 2014-11-09 MED ORDER — ONDANSETRON HCL 4 MG PO TABS: 4.0000 mg | ORAL_TABLET | Freq: Three times a day (TID) | ORAL | Status: DC | PRN

## 2014-11-09 MED ORDER — FUROSEMIDE 20 MG PO TABS: 20.0000 mg | ORAL_TABLET | Freq: Every day | ORAL | Status: DC

## 2014-11-09 MED ORDER — POTASSIUM CHLORIDE CRYS ER 10 MEQ PO TBCR: EXTENDED_RELEASE_TABLET | ORAL | Status: DC

## 2014-11-09 MED ORDER — CEPHALEXIN 500 MG PO CAPS: 500.0000 mg | ORAL_CAPSULE | Freq: Three times a day (TID) | ORAL | Status: DC

## 2014-11-09 NOTE — Progress Notes (Signed)
Subjective: Here with complaint of right leg cellulitis.  He notes that he has had prior injury to this leg years ago and has had cellulitis in the same leg before.  3 days ago he was filling chills, nausea, didn't feel good and within hours started noticing redness and swelling of his right leg in the same place he had a prior cellulitis. That leg in general gets dry and flaky skin at times cracking open. He does get swelling in the legs somewhat regularly but does not use compression hose. He denies fever, numbness, tingling, weakness, no chest pain or shortness of breath, no recent injury fall or trauma.No other aggravating or relieving factors. No other complaint.  Past Medical History  Diagnosis Date  . Allergy     RHINITIS  . Hypertension   . Obesity, morbid (more than 100 lbs over ideal weight or BMI > 40)   . Arthritis     HIP  . Diverticulosis    Review of systems as in subjective  Objective: BP 130/70 mmHg  Pulse 88  Temp(Src) 98.1 F (36.7 C) (Oral)  Resp 16  Wt 340 lb (154.223 kg)  Gen: wd, wn, nad, morbidly obese white male Skin: Right lower leg midshaft to distal third with circumferential erythema, slight warmth, dry cracked skin anteriorly.  No induration or fluctuance Heart: rrr, normal s1, s2, no murmur Lungs clear Ext: 2+ pitting edema of right lower leg, 1+ left leg, mild varicosities noted right posterior leg just distal to popliteal area Pulses: normal pedal pulses   Assessment: Encounter Diagnoses  Name Primary?  . Cellulitis of leg, right Yes  . Nausea without vomiting   . Chills   . Chronic venous insufficiency   . Morbid obesity   . Varicose veins    Plan: Discussed his symptoms and exam findings suggestive of cellulitis of the right leg, lower leg.  Discussed possible risk factors and causes.  He is obese, sedentary, prior impaired fasting glucose, varicose veins and venous insufficiency noted.  Advise for the weekend he elevate the leg most of the  weekend, begin Keflex antibiotic 3 times daily, in addition to his normal HCTZ, add Lasix on temporarily through the weekend, potassium along with Lasix, Zofran for nausea, rest, hydrate well.  Labs today.  If worse or not improving through the weekend, then call the after hours line or go to the hospital.  Otherwise recheck next week

## 2014-11-09 NOTE — Patient Instructions (Signed)
Encounter Diagnoses  Name Primary?  . Cellulitis of leg, right Yes  . Nausea without vomiting   . Chills   . Chronic venous insufficiency    Recommendations:  Elevate your legs a lot over the weekend to help with swelling  Begin Keflex 3 times daily antibiotic x 10 days  You may use Zofran for nausea and vomiting  Begin Lasix fluid pill daily in the morning along with your other medications  Recheck next week  We will call with lab results

## 2014-11-12 ENCOUNTER — Encounter: Payer: Self-pay | Admitting: Medical

## 2014-11-13 ENCOUNTER — Other Ambulatory Visit: Payer: Self-pay | Admitting: Medical

## 2014-11-13 MED ORDER — POTASSIUM CHLORIDE CRYS ER 20 MEQ PO TBCR: 20.0000 meq | EXTENDED_RELEASE_TABLET | Freq: Two times a day (BID) | ORAL | Status: DC

## 2014-11-16 ENCOUNTER — Other Ambulatory Visit: Payer: Self-pay | Admitting: Family Medicine

## 2014-11-16 ENCOUNTER — Ambulatory Visit (INDEPENDENT_AMBULATORY_CARE_PROVIDER_SITE_OTHER): Payer: BC Managed Care – PPO | Admitting: Medical

## 2014-11-16 ENCOUNTER — Other Ambulatory Visit (HOSPITAL_COMMUNITY): Payer: Self-pay | Admitting: Medical

## 2014-11-16 ENCOUNTER — Ambulatory Visit (HOSPITAL_COMMUNITY)
Admission: RE | Admit: 2014-11-16 | Discharge: 2014-11-16 | Disposition: A | Discharge status: Home / Self Care | Payer: BC Managed Care – PPO | Source: Ambulatory Visit | Attending: Family Medicine | Admitting: Family Medicine

## 2014-11-16 ENCOUNTER — Encounter: Payer: Self-pay | Admitting: Medical

## 2014-11-16 VITALS — BP 120/70 | HR 82 | Temp 98.3°F | Resp 12

## 2014-11-16 DIAGNOSIS — I872 Venous insufficiency (chronic) (peripheral): Secondary | ICD-10-CM | POA: Diagnosis not present

## 2014-11-16 DIAGNOSIS — R791 Abnormal coagulation profile: Secondary | ICD-10-CM | POA: Diagnosis not present

## 2014-11-16 DIAGNOSIS — M25561 Pain in right knee: Secondary | ICD-10-CM

## 2014-11-16 DIAGNOSIS — I868 Varicose veins of other specified sites: Secondary | ICD-10-CM | POA: Diagnosis not present

## 2014-11-16 DIAGNOSIS — M7989 Other specified soft tissue disorders: Secondary | ICD-10-CM | POA: Diagnosis not present

## 2014-11-16 DIAGNOSIS — E876 Hypokalemia: Secondary | ICD-10-CM

## 2014-11-16 DIAGNOSIS — L03115 Cellulitis of right lower limb: Secondary | ICD-10-CM | POA: Diagnosis present

## 2014-11-16 DIAGNOSIS — I8390 Asymptomatic varicose veins of unspecified lower extremity: Secondary | ICD-10-CM

## 2014-11-16 DIAGNOSIS — I82401 Acute embolism and thrombosis of unspecified deep veins of right lower extremity: Secondary | ICD-10-CM

## 2014-11-16 LAB — BASIC METABOLIC PANEL
BUN: 23 mg/dL (ref 6–23)
CO2: 32 meq/L (ref 19–32)
CREATININE: 1.23 mg/dL (ref 0.50–1.35)
Calcium: 9.7 mg/dL (ref 8.4–10.5)
Chloride: 99 mEq/L (ref 96–112)
Glucose, Bld: 117 mg/dL — ABNORMAL HIGH (ref 70–99)
Potassium: 3.1 mEq/L — ABNORMAL LOW (ref 3.5–5.3)
Sodium: 141 mEq/L (ref 135–145)

## 2014-11-16 LAB — D-DIMER, QUANTITATIVE: D-Dimer, Quant: 0.93 ug/mL-FEU — ABNORMAL HIGH (ref 0.00–0.48)

## 2014-11-16 MED ORDER — AMOXICILLIN-POT CLAVULANATE 875-125 MG PO TABS: 1.0000 | ORAL_TABLET | Freq: Two times a day (BID) | ORAL | Status: DC

## 2014-11-16 NOTE — Progress Notes (Signed)
Subjective: Here with recheck of right leg swelling and redness, cellulitis.  Since last visit using Lasix 20mg  daily, potassium BID, Keflex TID antibiotic and his usual medications.   At his last visit he reported that he has had prior injury to this leg years ago and has had cellulitis in the same leg before. When his leg started looking swollen and red before his recent visit here, he was having chills, nausea, didn't feel good and within hours started noticing redness and swelling of his right leg in the same place he had a prior cellulitis. That leg in general gets dry and flaky skin at times cracking open. He does get swelling in the legs somewhat regularly but does not use compression hose. He denies fever, numbness, tingling, weakness, no chest pain or shortness of breath, no recent injury fall or trauma.  No other aggravating or relieving factors. No other complaint.  Past Medical History  Diagnosis Date  . Allergy     RHINITIS  . Hypertension   . Obesity, morbid (more than 100 lbs over ideal weight or BMI > 40)   . Arthritis     HIP  . Diverticulosis    Review of systems as in subjective  Objective: BP 120/70 mmHg  Pulse 82  Temp(Src) 98.3 F (36.8 C) (Oral)  Resp 12  SpO2 98%  Gen: wd, wn, nad, morbidly obese white male Skin: Right lower leg midshaft to distal third with circumferential erythema, slight warmth, no induration or fluctuance Heart: rrr, normal s1, s2, no murmur Lungs clear MSK: Tender over the erythematous part of the right lower leg, no palpable cord Ext: Decreased swelling of the right leg today compared to a few days ago but there is still mild 1+ pitting edema of the right lower leg, there are sizable varicosities of the right posterior lower leg throughout including in the popliteal area, mild varicosities left lower leg Pulses: normal pedal pulses   Assessment: Encounter Diagnoses  Name Primary?  . Cellulitis of leg, right Yes  . Varicose veins   .  Chronic venous insufficiency   . Morbid obesity   . Hypokalemia   . Right leg swelling      Plan: STAT labs for D-dimer and BMET today for potassium and d-dimer.   Will pursue venous insuffuciay evaluation through [facility].   For now, c/t Keflex, add Augmentin, rest, elevation, c/t Lasix, c/t good hydration.  We will call with lab results and plan.

## 2014-11-16 NOTE — Progress Notes (Signed)
*  PRELIMINARY RESULTS* Vascular Ultrasound Lower extremity venous duplex has been completed.  Preliminary findings: No evidence of DVT in visualized veins.  Called results to Chana Bode, [place], RDMS, RVT  11/16/2014, 3:37 PM

## 2014-11-20 ENCOUNTER — Other Ambulatory Visit: Payer: Self-pay | Admitting: Family Medicine

## 2014-11-20 DIAGNOSIS — L03115 Cellulitis of right lower limb: Secondary | ICD-10-CM

## 2014-11-26 ENCOUNTER — Telehealth: Payer: Self-pay | Admitting: Medical

## 2014-11-26 ENCOUNTER — Encounter: Payer: Self-pay | Admitting: Medical

## 2014-11-26 MED ORDER — CEPHALEXIN 500 MG PO CAPS: 500.0000 mg | ORAL_CAPSULE | Freq: Three times a day (TID) | ORAL | Status: DC

## 2014-11-26 MED ORDER — DICLOFENAC SODIUM 75 MG PO TBEC: 75.0000 mg | DELAYED_RELEASE_TABLET | Freq: Two times a day (BID) | ORAL | Status: AC

## 2014-11-26 NOTE — Telephone Encounter (Signed)
I refilled the 2 medications he requested but please have him follow-up with Dr. Redmond School

## 2014-11-29 ENCOUNTER — Ambulatory Visit
Admission: RE | Admit: 2014-11-29 | Discharge: 2014-11-29 | Disposition: A | Discharge status: Home / Self Care | Payer: BC Managed Care – PPO | Source: Ambulatory Visit | Attending: Medical | Admitting: Medical

## 2014-11-29 DIAGNOSIS — L03115 Cellulitis of right lower limb: Secondary | ICD-10-CM

## 2014-11-29 DIAGNOSIS — I82401 Acute embolism and thrombosis of unspecified deep veins of right lower extremity: Secondary | ICD-10-CM

## 2014-11-29 NOTE — Consult Note (Signed)
Chief Complaint: Right lower extremity cellulitis  Referring Physician(s): Tysinger,David S  History of Present Illness: Jeremy Ward is a 59 y.o. male who describes a three-week history of cellulitis and his distal calf, which is improving with antibiotic treatment. Lower extremity DVT study was negative. No history of varicose/spider vein treatment. No history of PE, DVT, or venous ulceration. He uses knee-high over-the-counter support hose with partial relief of symptoms. He works in Engineer, mining for [employer], requiring sitting for long periods. He gets minimal relief of symptoms with leg elevation. He additionally describes bilateral lower extremity swelling and a long history of lower extremity claudication.  Past Medical History  Diagnosis Date  . Allergy     RHINITIS  . Hypertension   . Obesity, morbid (more than 100 lbs over ideal weight or BMI > 40)   . Arthritis     HIP  . Diverticulosis     Past Surgical History  Procedure Laterality Date  . Tonsillectomy and adenoidectomy  1963  . Carpal tunnel release    . Joint replacement      RIGHT HIP  . Eye surgery  1964    Allergies: Review of patient's allergies indicates no known allergies.  Medications: Prior to Admission medications   Medication Sig Start Date End Date Taking? Authorizing Provider  acetaminophen (TYLENOL) 500 MG tablet Take 1,000 mg by mouth every morning.    Historical Provider, MD  amoxicillin-clavulanate (AUGMENTIN) 875-125 MG per tablet Take 1 tablet by mouth 2 (two) times daily. 11/16/14   Camelia Eng Tysinger, PA-C  cephALEXin (KEFLEX) 500 MG capsule Take 1 capsule (500 mg total) by mouth 3 (three) times daily. 11/26/14   Camelia Eng Tysinger, PA-C  diclofenac (VOLTAREN) 75 MG EC tablet Take 1 tablet (75 mg total) by mouth 2 (two) times daily. 11/26/14   Camelia Eng Tysinger, PA-C  furosemide (LASIX) 20 MG tablet Take 1 tablet (20 mg total) by mouth daily. 11/09/14   Camelia Eng Tysinger, PA-C    hydrochlorothiazide (HYDRODIURIL) 25 MG tablet Take 1 tablet (25 mg total) by mouth daily. 12/28/13   Denita Lung, MD  potassium chloride SA (K-DUR,KLOR-CON) 20 MEQ tablet Take 1 tablet (20 mEq total) by mouth 2 (two) times daily. 11/13/14   Camelia Eng Tysinger, PA-C  triamcinolone (NASACORT ALLERGY 24HR) 55 MCG/ACT AERO nasal inhaler Place 2 sprays into the nose daily.    Historical Provider, MD  verapamil (VERELAN PM) 360 MG 24 hr capsule Take 360 mg by mouth daily.    Historical Provider, MD     Family History  Problem Relation Age of Onset  . Heart disease Mother   . Arthritis Mother   . Heart disease Father     History   Social History  . Marital Status: Married    Spouse Name: N/A  . Number of Children: N/A  . Years of Education: N/A   Social History Main Topics  . Smoking status: Never Smoker   . Smokeless tobacco: Never Used  . Alcohol Use: No  . Drug Use: No  . Sexual Activity: Yes   Other Topics Concern  . Not on file   Social History Narrative    ECOG Status: 2 - Symptomatic, <50% confined to bed  Review of Systems: A 12 point ROS discussed and pertinent positives are indicated in the HPI above.  All other systems are negative.  Review of Systems  Constitutional: Positive for fatigue.  Genitourinary: Positive for urgency.  Musculoskeletal: Positive for arthralgias.  Skin: Positive for rash.     Vital Signs: 192/88 p79 t 98.2 o2 96% 5'5.5" 340# Physical Exam  Constitutional: He is oriented to person, place, and time. He appears well-developed and well-nourished. No distress.  HENT:  Head: Normocephalic.  Eyes: Conjunctivae and EOM are normal. Right eye exhibits no discharge. Left eye exhibits no discharge. No scleral icterus.  Neck: Normal range of motion.  Cardiovascular:  Pulses:      Dorsalis pedis pulses are 3+ on the right side, and 3+ on the left side.  Pulmonary/Chest: Effort normal. No stridor. No respiratory distress.  Abdominal: Soft.   Musculoskeletal:       Right ankle: He exhibits swelling.       Left ankle: He exhibits swelling.       Right lower leg: He exhibits swelling and edema.  Neurological: He is alert and oriented to person, place, and time. He exhibits normal muscle tone. Coordination normal.  Skin: Skin is warm and intact. He is not diaphoretic.  Psychiatric: He has a normal mood and affect. His behavior is normal. Judgment and thought content normal.        Imaging: US Venous Img Lower Unilateral Right   Status: Final result       PACS Images     Show images for US Venous Img Lower Unilateral Right     Study Result     CLINICAL DATA: Right lower extremity cellulitis. Palpable posterior calf varicose veins.  EXAM: RIGHT LOWER EXTREMITY VENOUS DUPLEX ULTRASOUND  TECHNIQUE: Gray-scale sonography with graded compression, as well as color Doppler and duplex ultrasound, were performed to evaluate the deep and superficial veins of RIGHT lower extremities. Spectral Doppler was utilized to evaluate flow at rest and with distal augmentation maneuvers. A complete superficial venous insufficiency exam was performed in the upright standing position. I personally performed the technical portion of the exam.  COMPARISON: None.  FINDINGS: Deep Venous System:  Evaluation of the deep venous system including the common femoral, femoral, profunda femoral, popliteal and calf veins (where visible) demonstrate no evidence of deep venous thrombosis. The vessels are compressible and demonstrate normal respiratory phasicity and response to augmentation. No evidence of the deep venous reflux.  Superficial Venous System:  SFJ: patent. no reflux with provocative maneuvers.  An anterior accessory branch from the greater saphenous vein extends anterolaterally, draining a dilated varicose vein complex from the posterior calf (corresponding to the palpable abnormality), lateral knee, and  distal anterolateral thigh. No significant valvular incompetence or reflux is demonstrated on survey interrogation however.  GSV Prox Thigh: Normal in caliber, no reflux.  GSV Lower Thigh: Normal in caliber, no reflux.  GSV at Knee: Normal in caliber, no reflux.  GSV Prox Calf: Normal in caliber, no reflux.  GSV Mid Calf: Normal in caliber, no reflux. Perforator vein is noted, with no reflux.  GSV Distal Calf: Normal in caliber, no reflux.  SPJ: Not visualized  SSV Mid: Normal in caliber, no reflux.  SSV Distal: Normal in caliber, no reflux.  Other: Subcutaneous edema in the lower calf.  IMPRESSION: 1. Normal right lower extremity deep venous system. No evidence of acute or chronic PE. 2. Palpable posterior calf varicose veins correspond to a complex extending across the lateral knee and distal anterolateral thigh into an anterior accessory GSV branch, without valvular incompetence or reflux. 3. Remainder of the name superficial venous system shows no dilatation, valvular incompetence or reflux.    Labs:  CBC:  Recent Labs  12/28/13 0943 11/09/14 1148  WBC 5.3 5.2  HGB 14.5 14.0  HCT 41.3 41.3  PLT 187 176    COAGS: No results for input(s): INR, APTT in the last 8760 hours.  BMP:  Recent Labs  12/28/13 0943 11/09/14 1148 11/16/14 1012  NA 141 142 141  K 3.0* 2.7* 3.1*  CL 103 101 99  CO2 30 32 32  GLUCOSE 116* 116* 117*  BUN 19 18 23   CALCIUM 9.4 8.8 9.7  CREATININE 1.04 1.07 1.23    LIVER FUNCTION TESTS:  Recent Labs  12/28/13 0943  BILITOT 0.5  AST 17  ALT 14  ALKPHOS 49  PROT 6.1  ALBUMIN 4.0    TUMOR MARKERS: No results for input(s): AFPTM, CEA, CA199, CHROMGRNA in the last 8760 hours.  Assessment and Plan: The right lower extremity deep venous system is normal, and there is no significant valvular incompetence or reflux in the superficial venous system of the right lower extremity to contribute to his recent  cellulitis or leg swelling. My impression is that this patient's palpable varicose veins in the posterior calf represent a component of an extensive system draining ultimately into an accessory anterior branch of the greater saphenous vein. However, there is no valvular incompetence incompetence or reflux identified in this segment nor in any other segments of the named superficial venous system to indicate any need for venous ablation. I doubt therefore that the varicose veins are contributing significantly to his cellulitis or delayed healing. As the veins themselves are not symptomatic, there is no indication for direct injection sclerotherapy or other treatment directed at focal symptomatic veins. Recommendation to the patient was to continue with graduated compression hose, recommended to thigh high 20-30 mmHg  instead of his knee-high variety. I gave him for a prescription for these.  He knows to contact us shoulany new vein symptoms develop., And we can see him on a when necessary basis. Marland Kitchen  He will follow-up primarily with you.  Thank you for this interesting consult.  I greatly enjoyed meeting Jeremy Ward and look forward to participating in their care.  Signed: Shanterica Biehler III, DAYNE Merriam Brandner 11/29/2014, 8:33 AM   I spent a total of 45 minutes   in face to face in clinical consultation, greater than 50% of which was counseling/coordinating care for varicose veins.

## 2014-11-30 ENCOUNTER — Telehealth: Payer: Self-pay | Admitting: Medical

## 2014-11-30 NOTE — Telephone Encounter (Signed)
Fortunately after reviewing his vascular consult, there is no specific blood flow problem in his leg.  He was prescribed compression hose by them and should start them.   The other recommendation I have to prevent the leg issues in the future is regular exercise and weight loss.   I would recommend he start a walking program 15-20 minutes daily and gradually working up to 60 min walking most days of the week.

## 2014-11-30 NOTE — Telephone Encounter (Signed)
Patient is aware of Dorothea Ogle PA message and recommendations

## 2014-12-13 ENCOUNTER — Other Ambulatory Visit: Payer: Self-pay | Admitting: Medical

## 2014-12-13 ENCOUNTER — Other Ambulatory Visit: Payer: Self-pay | Admitting: Family Medicine

## 2014-12-13 MED ORDER — CEPHALEXIN 500 MG PO CAPS: 500.0000 mg | ORAL_CAPSULE | Freq: Three times a day (TID) | ORAL | Status: DC

## 2015-01-01 ENCOUNTER — Encounter: Payer: Self-pay | Admitting: Family Medicine

## 2015-01-04 ENCOUNTER — Telehealth: Payer: Self-pay | Admitting: Family Medicine

## 2015-01-04 MED ORDER — CEPHALEXIN 500 MG PO CAPS
500.0000 mg | ORAL_CAPSULE | Freq: Three times a day (TID) | ORAL | Status: DC
Start: 2015-01-04 — End: 2015-08-13

## 2015-01-04 NOTE — Telephone Encounter (Signed)
Patient aware.

## 2015-01-04 NOTE — Telephone Encounter (Signed)
Called wanting the status of this med

## 2015-01-04 NOTE — Telephone Encounter (Signed)
Let him know I just called it in again

## 2015-01-04 NOTE — Telephone Encounter (Signed)
Pt called to ask if keflex had been sent in. I see the email conversation between you two but I don't see where it was actually sent. Please send.

## 2015-01-17 ENCOUNTER — Other Ambulatory Visit: Payer: Self-pay | Admitting: Family Medicine

## 2015-01-23 ENCOUNTER — Encounter: Payer: Self-pay | Admitting: Family Medicine

## 2015-08-13 ENCOUNTER — Ambulatory Visit (INDEPENDENT_AMBULATORY_CARE_PROVIDER_SITE_OTHER): Payer: BC Managed Care – PPO | Admitting: Family Medicine

## 2015-08-13 VITALS — BP 140/80 | HR 86 | Wt 358.0 lb

## 2015-08-13 DIAGNOSIS — L03115 Cellulitis of right lower limb: Secondary | ICD-10-CM | POA: Diagnosis not present

## 2015-08-13 MED ORDER — SULFAMETHOXAZOLE-TRIMETHOPRIM 800-160 MG PO TABS: 1.0000 | ORAL_TABLET | Freq: Two times a day (BID) | ORAL | Status: DC

## 2015-08-13 NOTE — Progress Notes (Signed)
   Subjective:    Patient ID: Jeremy Ward, male    DOB: Mar 08, 1956, 59 y.o.   MRN: [id number]  HPI He is here for evaluation of slight discomfort in the right lower extremity and the tingling sensation. He has had difficulty with cellulitis in this area in the past and think that it is occurring again. Review his record indicates he was given Keflex on his last encounter for this.   Review of Systems     Objective:   Physical Exam Alert and in no distress. Right shin does show some pigmentation changes that are brownish in nature. It is slightly warm to touch.       Assessment & Plan:  Cellulitis of right leg - Plan: sulfamethoxazole-trimethoprim (BACTRIM DS,SEPTRA DS) 800-160 MG tablet  this most likely is synovitis again and it has been difficult to treat the past. I will place him on Cipro. He states that in the past when he was placed on an antibiotic, his urinary tract symptoms also improved. He was mainly having difficulty with nocturia. He will return here in one month.

## 2015-09-12 ENCOUNTER — Telehealth: Payer: Self-pay | Admitting: Family Medicine

## 2015-09-12 NOTE — Telephone Encounter (Signed)
Pt called and was wanting to know if he could he could try something different than sulfamethoxazole-trimethoprim, says it makes him dry, wants to know if he could go back to using the keflex, but if you dont think its a good idea he will stay on the sulfamethoxazole-trimethoprim, pt uses [facility] [place].

## 2015-09-13 ENCOUNTER — Encounter: Payer: Self-pay | Admitting: Family Medicine

## 2015-09-13 MED ORDER — CEPHALEXIN 500 MG PO CAPS: 500.0000 mg | ORAL_CAPSULE | Freq: Three times a day (TID) | ORAL | Status: DC

## 2015-09-13 NOTE — Telephone Encounter (Signed)
Notified through mychart. 

## 2015-09-13 NOTE — Telephone Encounter (Signed)
He is having difficulty with side effects from the Septra. I will switch him to 3 times a day dosing of Keflex at his request.

## 2015-09-13 NOTE — Telephone Encounter (Signed)
Call and let them know that I called the different medication and and stop the other one. Have him throat it away

## 2015-09-27 ENCOUNTER — Other Ambulatory Visit: Payer: Self-pay | Admitting: Family Medicine

## 2015-09-27 MED ORDER — CEPHALEXIN 500 MG PO CAPS: 500.0000 mg | ORAL_CAPSULE | Freq: Three times a day (TID) | ORAL | Status: DC

## 2015-09-27 NOTE — Addendum Note (Signed)
Addended by: Denita Lung on: 09/27/2015 10:30 AM   Modules accepted: Orders

## 2015-10-22 MED FILL — VERAPAMIL 360 MG CAP PELLET: 360 | 90 days supply | Qty: 90 | Fill #3

## 2015-10-22 MED FILL — HYDROCHLOROTHIAZIDE 25 MG T: 25 | 90 days supply | Qty: 90 | Fill #3

## 2015-10-22 MED FILL — DICLOFENAC SODIUM 1% GEL: 1 | 50 days supply | Qty: 300 | Fill #1

## 2015-10-22 MED FILL — DICLOFENAC SOD EC 75 MG TAB: 75 | 45 days supply | Qty: 90 | Fill #1

## 2015-11-05 ENCOUNTER — Encounter: Payer: Self-pay | Admitting: Family Medicine

## 2015-11-05 MED ORDER — CEPHALEXIN 500 MG PO CAPS: 500.0000 mg | ORAL_CAPSULE | Freq: Three times a day (TID) | ORAL | Status: DC

## 2015-11-05 MED FILL — CEPHALEXIN 500 MG CAPSULE: 500 | 7 days supply | Qty: 42 | Fill #0

## 2015-11-05 NOTE — Telephone Encounter (Signed)
He is again complaining of symptoms of cellulitis. I will give  Keflex and have him follow up with me in 2 Weeks

## 2015-11-18 ENCOUNTER — Encounter: Payer: Self-pay | Admitting: Family Medicine

## 2015-11-21 ENCOUNTER — Encounter: Payer: Self-pay | Admitting: Family Medicine

## 2015-11-21 ENCOUNTER — Ambulatory Visit (INDEPENDENT_AMBULATORY_CARE_PROVIDER_SITE_OTHER): Payer: BC Managed Care – PPO | Admitting: Family Medicine

## 2015-11-21 VITALS — BP 148/82 | HR 84 | Resp 16 | Ht 65.5 in | Wt 353.6 lb

## 2015-11-21 DIAGNOSIS — L03115 Cellulitis of right lower limb: Secondary | ICD-10-CM | POA: Diagnosis not present

## 2015-11-21 DIAGNOSIS — R7302 Impaired glucose tolerance (oral): Secondary | ICD-10-CM | POA: Diagnosis not present

## 2015-11-21 LAB — POCT GLYCOSYLATED HEMOGLOBIN (HGB A1C): HEMOGLOBIN A1C: 5.9

## 2015-11-21 NOTE — Progress Notes (Signed)
   Subjective:    Patient ID: Jeremy Ward, male    DOB: [birth year], 60 y.o.   MRN: [id number]  HPI He is here for a recheck. He has had difficulty over the last several months with sialitis of the right shin. He has been on medication for this and recently finished it. This is seem to be getting better. He then developed a rash in his groin area and did take Diflucan which apparently got rid of it. This was given to him by his wife. He also has a rash present on his left medial upper thigh area.   Review of Systems     Objective:   Physical Exam Alert and in no distress. Difficult to examine the genital area due to excess adiposity. He does have a 3 x 4 cm slightly discolored area on the left medial upper thigh is not hot or tender. Hemoglobin A1c is 5.9       Assessment & Plan:  Cellulitis of right leg  Glucose intolerance (impaired glucose tolerance) - Plan: Amb ref to Medical Nutrition Therapy-MNT, HgB A1c  Morbid obesity due to excess calories ([place]) - Plan: Amb ref to Medical Nutrition Therapy-MNT No further therapy at this point for the sialitis. He will call if he has another outbreak of a rash in the inguinal area. Explained that the thigh rash might possibly be yeast related but difficult to say. Then discussed his weight and glucose intolerance. I will refer him to a nutritionist. He readily states that he allows to me things to get in the way of him taking better care of himself.

## 2015-12-06 ENCOUNTER — Ambulatory Visit (INDEPENDENT_AMBULATORY_CARE_PROVIDER_SITE_OTHER): Payer: BC Managed Care – PPO | Admitting: Family Medicine

## 2015-12-06 ENCOUNTER — Encounter: Payer: Self-pay | Admitting: Family Medicine

## 2015-12-06 VITALS — BP 140/90 | HR 80 | Wt 360.0 lb

## 2015-12-06 DIAGNOSIS — J069 Acute upper respiratory infection, unspecified: Secondary | ICD-10-CM | POA: Diagnosis not present

## 2015-12-06 MED ORDER — BENZONATATE 100 MG PO CAPS: 200.0000 mg | ORAL_CAPSULE | Freq: Three times a day (TID) | ORAL | Status: DC | PRN

## 2015-12-06 MED ORDER — HYDROCOD POLST-CPM POLST ER 10-8 MG/5ML PO SUER: 5.0000 mL | Freq: Two times a day (BID) | ORAL | Status: DC | PRN

## 2015-12-06 MED FILL — HYDROCODONE-CHLORPHENIRAM S: 10-8 | 12 days supply | Qty: 115 | Fill #0

## 2015-12-06 MED FILL — BENZONATATE 100 MG CAPSULE: 100 | 3 days supply | Qty: 20 | Fill #0

## 2015-12-06 NOTE — Progress Notes (Signed)
   Subjective:    Patient ID: Jeremy Ward, male    DOB: Oct 19, 1955, 60 y.o.   MRN: [id number]  HPI He complains of a three-day history of PND, dry cough, nasal congestion, right earache, hoarse voice but no sore throat, fever or malaise.   Review of Systems     Objective:   Physical Exam Alert and in no distress. Tympanic membranes and canals are normal. Pharyngeal area is normal. Neck is supple without adenopathy or thyromegaly. Cardiac exam shows a regular sinus rhythm without murmurs or gallops. Lungs are clear to auscultation.        Assessment & Plan:  Acute URI - Plan: benzonatate (TESSALON PERLES) 100 MG capsule, chlorpheniramine-HYDROcodone (TUSSIONEX PENNKINETIC ER) 10-8 MG/5ML SUER plan conservative care. Recommend he try Tessalon pearls first and if no benefit then use Tussionex for nighttime.

## 2015-12-06 NOTE — Patient Instructions (Signed)
useTessalon first and if that doesn't work then switched to the [employer]

## 2015-12-20 MED FILL — DICLOFENAC SODIUM 1% GEL: 1 | 50 days supply | Qty: 300 | Fill #0

## 2015-12-20 MED FILL — DICLOFENAC SOD EC 75 MG TAB: 75 | 45 days supply | Qty: 90 | Fill #2

## 2016-01-13 ENCOUNTER — Ambulatory Visit (INDEPENDENT_AMBULATORY_CARE_PROVIDER_SITE_OTHER): Payer: BC Managed Care – PPO | Admitting: Family Medicine

## 2016-01-13 ENCOUNTER — Encounter: Payer: Self-pay | Admitting: Family Medicine

## 2016-01-13 VITALS — BP 126/80 | HR 83 | Temp 97.7°F | Wt 354.2 lb

## 2016-01-13 DIAGNOSIS — J309 Allergic rhinitis, unspecified: Secondary | ICD-10-CM

## 2016-01-13 DIAGNOSIS — J0100 Acute maxillary sinusitis, unspecified: Secondary | ICD-10-CM | POA: Diagnosis not present

## 2016-01-13 MED ORDER — TRIAMCINOLONE ACETONIDE 55 MCG/ACT NA AERO: 2.0000 | INHALATION_SPRAY | Freq: Every day | NASAL | Status: DC

## 2016-01-13 MED ORDER — AMOXICILLIN 875 MG PO TABS: 875.0000 mg | ORAL_TABLET | Freq: Two times a day (BID) | ORAL | Status: DC

## 2016-01-13 MED FILL — AMOXICILLIN 875 MG TABLET: 875 | 10 days supply | Qty: 20 | Fill #0

## 2016-01-13 NOTE — Progress Notes (Signed)
   Subjective:    Patient ID: Jeremy Ward, male    DOB: 05-23-1956, 60 y.o.   MRN: [id number]  HPI He complains of a one-week history of difficulty with ear and sinus congestion and pressure sensation. On Friday he developed fever, chills, increasing nasal congestion, right earache and sinus pressure as well as upper tooth discomfort. No sore throat, chest pain or shortness of breathHe does have underlying allergies and presently is using Nasacort.He uses the Nasacort as needed.He does not smoke.   Review of Systems     Objective:   Physical Exam Alert and in no distress. Tympanic membranes and canals are normal. Pharyngeal area is normal. Neck is supple without adenopathy or thyromegaly. Cardiac exam shows a regular sinus rhythm without murmurs or gallops. Lungs are clear to auscultation. His mucosa is normal but slight tenderness over maxillary sinuses       Assessment & Plan:  Allergic rhinitis, unspecified allergic rhinitis type - Plan: triamcinolone (NASACORT ALLERGY 24HR) 55 MCG/ACT AERO nasal inhaler  Acute maxillary sinusitis, recurrence not specified - Plan: amoxicillin (AMOXIL) 875 MG tablet Will call if not entirely better when he finishes the antibiotic. Nasacort.

## 2016-01-20 ENCOUNTER — Other Ambulatory Visit: Payer: Self-pay | Admitting: Family Medicine

## 2016-01-20 MED FILL — VERAPAMIL 360 MG CAP PELLET: 360 | 90 days supply | Qty: 90 | Fill #0

## 2016-01-20 MED FILL — HYDROCHLOROTHIAZIDE 25 MG T: 25 | 90 days supply | Qty: 90 | Fill #0

## 2016-01-27 ENCOUNTER — Encounter: Payer: Self-pay | Admitting: Family Medicine

## 2016-01-27 MED ORDER — FLUCONAZOLE 100 MG PO TABS: 100.0000 mg | ORAL_TABLET | Freq: Every day | ORAL | Status: DC

## 2016-01-27 MED FILL — FLUCONAZOLE 100 MG TABLET: 100 | 7 days supply | Qty: 7 | Fill #0

## 2016-01-27 NOTE — Telephone Encounter (Signed)
He is complaining of a yeast infection in the inguinal area after using antibiotics. I will give him 1 week's worth of Diflucan.

## 2016-02-17 MED FILL — DICLOFENAC SODIUM 1% GEL: 1 | 50 days supply | Qty: 300 | Fill #1

## 2016-02-19 DIAGNOSIS — M1712 Unilateral primary osteoarthritis, left knee: Secondary | ICD-10-CM | POA: Diagnosis not present

## 2016-02-19 DIAGNOSIS — M25562 Pain in left knee: Secondary | ICD-10-CM | POA: Diagnosis not present

## 2016-03-16 MED FILL — DICLOFENAC SOD EC 75 MG TAB: 75 | 45 days supply | Qty: 90 | Fill #3

## 2016-03-31 MED FILL — DICLOFENAC SODIUM 1% GEL: 1 | 50 days supply | Qty: 300 | Fill #2

## 2016-04-13 IMAGING — CR DG KNEE 1-2V*R*
2 series · 2 of 2 positions shown · non-contrast
Comparison: None.

CLINICAL DATA: Two week history of knee pain with no trauma

EXAM:
RIGHT KNEE - 1-2 VIEW

[t knee ap right]
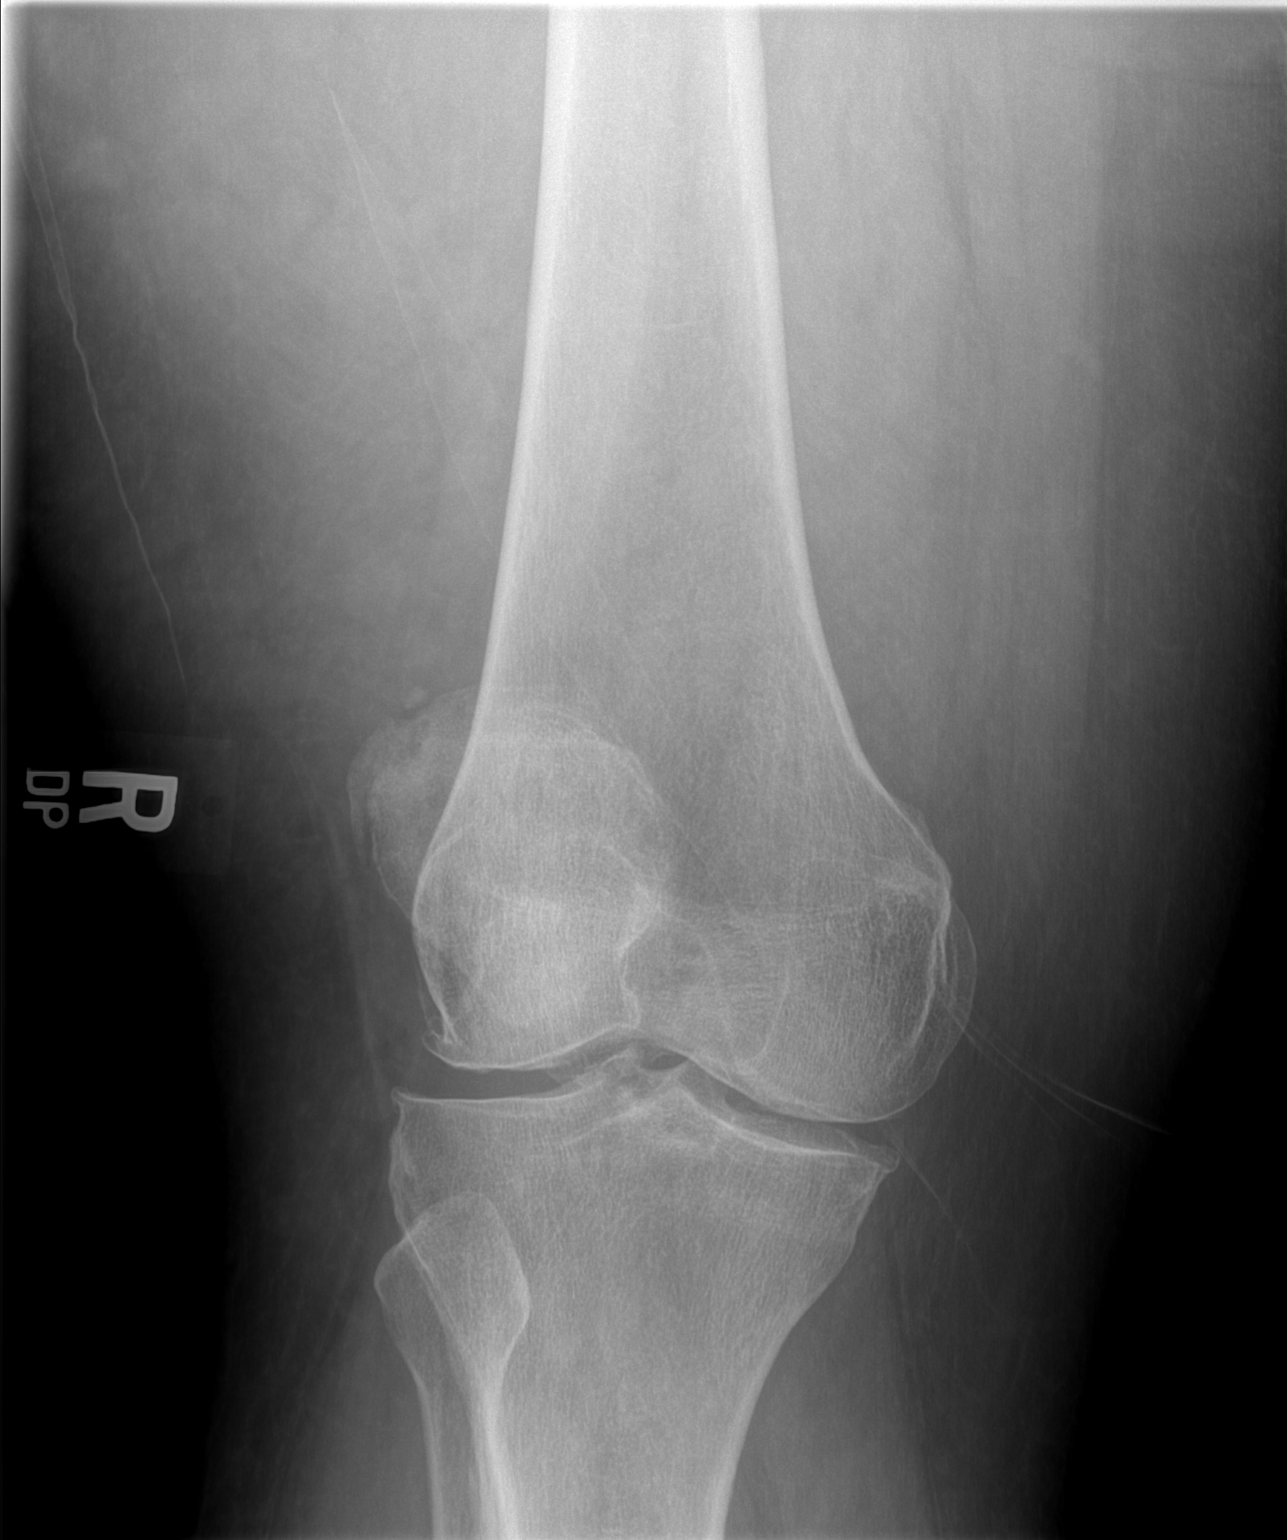

[t knee lat right]
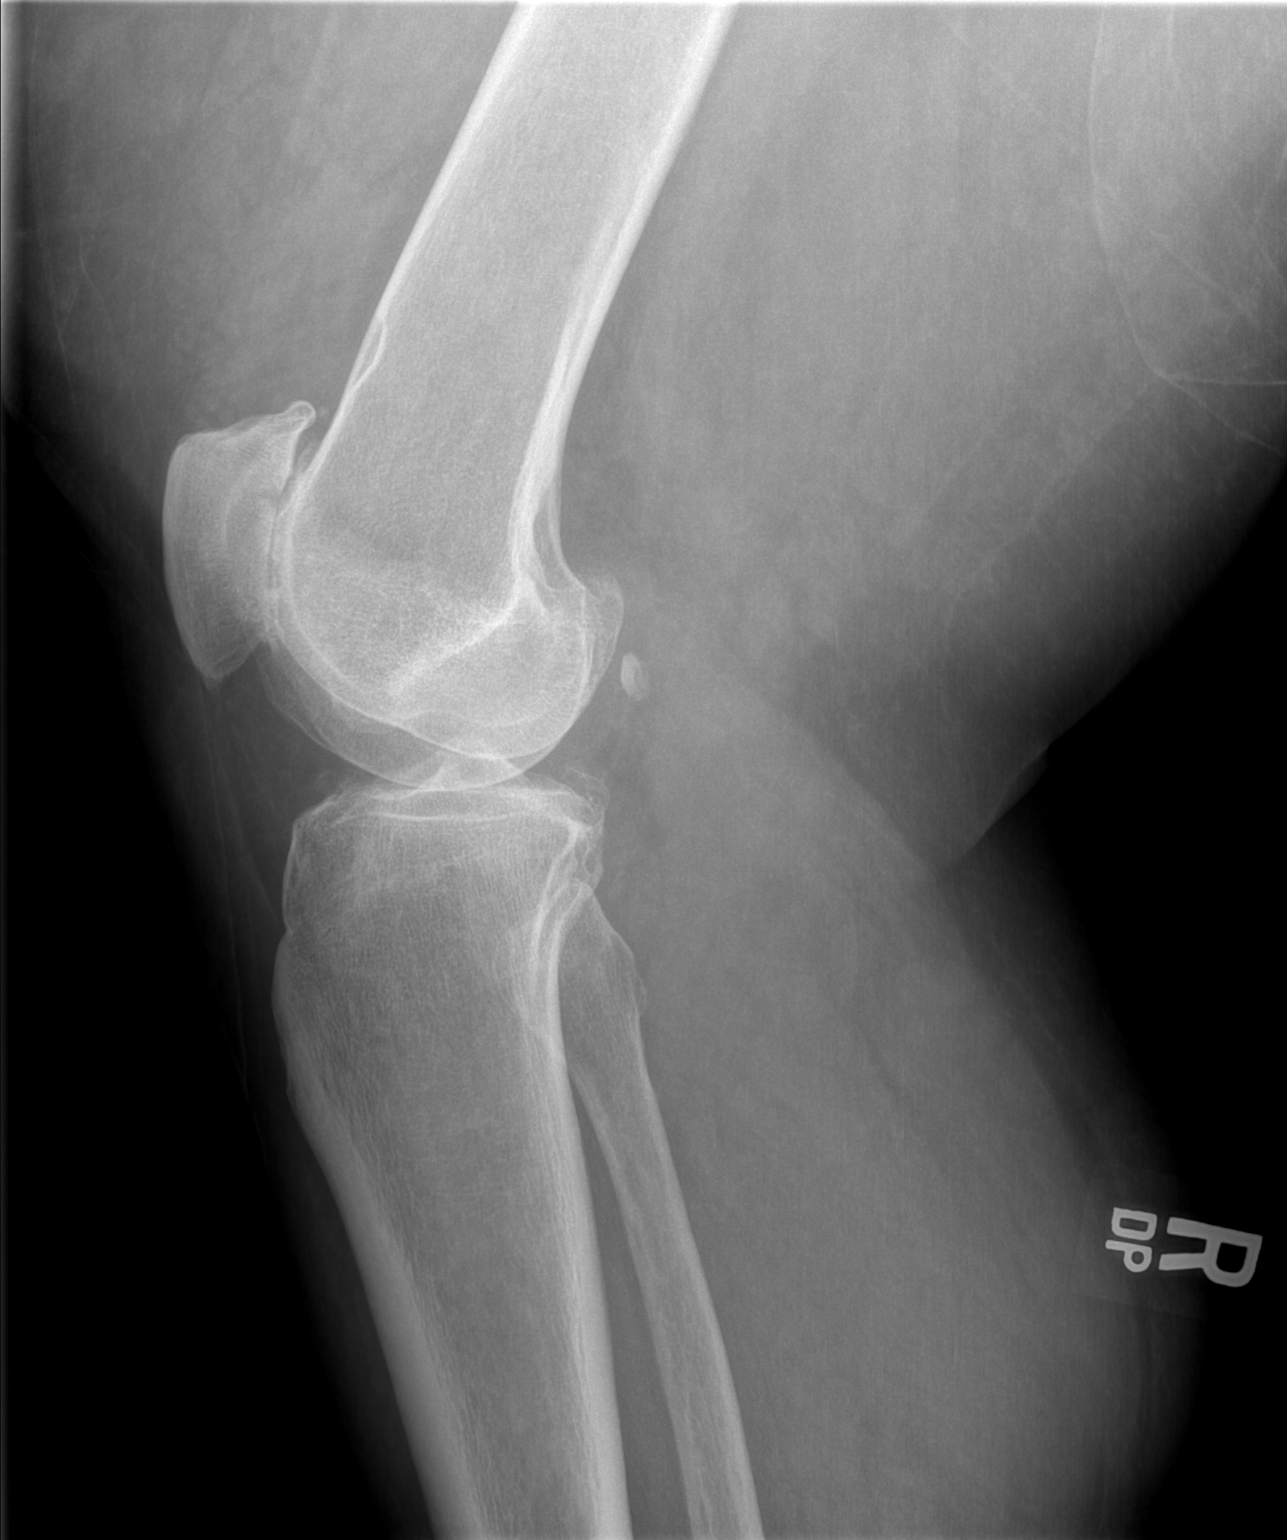

[2 of 2 positions shown; findings below may reference images not displayed]

FINDINGS: Severe narrowing and moderate osteophyte formation of the
patellofemoral compartment. There is subchondral cystic change along
the posterior surface of the patella. Mild narrowing and osteophyte
formation of the medial and lateral compartments. No fracture or
dislocation. There is evidence of patellar retinaculum
calcifications and patellar tendon calcifications.
IMPRESSION: Degenerative changes most prominent in the patellofemoral
compartment.

## 2016-04-20 MED FILL — VERAPAMIL 360 MG CAP PELLET: 360 | 90 days supply | Qty: 90 | Fill #1

## 2016-04-20 MED FILL — HYDROCHLOROTHIAZIDE 25 MG T: 25 | 90 days supply | Qty: 90 | Fill #1

## 2016-05-08 MED FILL — DICLOFENAC SODIUM 1% GEL: 1 | 50 days supply | Qty: 300 | Fill #3

## 2016-06-01 MED FILL — DICLOFENAC SOD EC 75 MG TAB: 75 | 45 days supply | Qty: 90 | Fill #0

## 2016-06-05 ENCOUNTER — Encounter: Payer: Self-pay | Admitting: Family Medicine

## 2016-06-05 MED ORDER — CEPHALEXIN 500 MG PO CAPS: 500.0000 mg | ORAL_CAPSULE | Freq: Three times a day (TID) | ORAL | 0 refills | Status: DC

## 2016-06-05 MED FILL — CEPHALEXIN 500 MG CAPSULE: 500 | 14 days supply | Qty: 42 | Fill #0

## 2016-07-17 MED FILL — DICLOFENAC SODIUM 1% GEL: 1 | 50 days supply | Qty: 300 | Fill #0

## 2016-07-22 MED FILL — VERAPAMIL 360 MG CAP PELLET: 360 | 90 days supply | Qty: 90 | Fill #2

## 2016-07-22 MED FILL — DICLOFENAC SOD 75 MG TAB EC: 75 | 45 days supply | Qty: 90 | Fill #1

## 2016-07-22 MED FILL — HYDROCHLOROTHIAZIDE 25 MG T: 25 | 90 days supply | Qty: 90 | Fill #2

## 2016-08-21 ENCOUNTER — Telehealth: Payer: BC Managed Care – PPO | Admitting: Family

## 2016-08-21 DIAGNOSIS — J329 Chronic sinusitis, unspecified: Secondary | ICD-10-CM

## 2016-08-21 DIAGNOSIS — B9689 Other specified bacterial agents as the cause of diseases classified elsewhere: Secondary | ICD-10-CM

## 2016-08-21 MED ORDER — AMOXICILLIN-POT CLAVULANATE 875-125 MG PO TABS: 1.0000 | ORAL_TABLET | Freq: Two times a day (BID) | ORAL | 0 refills | Status: AC

## 2016-08-21 MED FILL — AMOX-CLAV 875-125 MG TABLET: 875-125 | 7 days supply | Qty: 14 | Fill #0

## 2016-08-21 NOTE — Progress Notes (Signed)

## 2016-09-08 ENCOUNTER — Encounter: Payer: Self-pay | Admitting: Family Medicine

## 2016-09-08 MED ORDER — CEPHALEXIN 500 MG PO CAPS: 500.0000 mg | ORAL_CAPSULE | Freq: Three times a day (TID) | ORAL | 0 refills | Status: DC

## 2016-09-08 NOTE — Telephone Encounter (Signed)
He apparently is having another outbreak of cellulitis. I will call in Keflex for him. Me in 2 weeks.

## 2016-09-15 ENCOUNTER — Ambulatory Visit (INDEPENDENT_AMBULATORY_CARE_PROVIDER_SITE_OTHER): Payer: BC Managed Care – PPO | Admitting: Family Medicine

## 2016-09-15 ENCOUNTER — Encounter: Payer: Self-pay | Admitting: Family Medicine

## 2016-09-15 VITALS — BP 126/80 | HR 80 | Wt 353.0 lb

## 2016-09-15 DIAGNOSIS — L739 Follicular disorder, unspecified: Secondary | ICD-10-CM | POA: Diagnosis not present

## 2016-09-15 MED ORDER — SULFAMETHOXAZOLE-TRIMETHOPRIM 800-160 MG PO TABS: 1.0000 | ORAL_TABLET | Freq: Two times a day (BID) | ORAL | 0 refills | Status: DC

## 2016-09-15 MED FILL — SULFAMETHOXAZOLE/TMP DS TAB: 800-160 | 10 days supply | Qty: 20 | Fill #0

## 2016-09-15 MED FILL — DICLOFENAC SODIUM 1% GEL: 1 | 50 days supply | Qty: 300 | Fill #1

## 2016-09-15 MED FILL — DICLOFENAC SOD 75 MG TAB EC: 75 | 45 days supply | Qty: 90 | Fill #2

## 2016-09-15 NOTE — Progress Notes (Signed)
   Subjective:    Patient ID: Jeremy Ward, male    DOB: 1956-05-02, 60 y.o.   MRN: [id number]  HPI Is here for evaluation of a pruritic type rash on his abdomen and to lesser extent on his back. He was recently treated for recurrent lower extremity cellulitis. He states that that is doing much better.   Review of Systems     Objective:   Physical Exam Alert and in no distress. Abdominal exam shows scattered purpuric areas as well as follicular lesions mainly on his abdomen but to lesser extent on his back. None were noted on his legs. The legs do look the best I've seen him.       Assessment & Plan:  Folliculitis - Plan: sulfamethoxazole-trimethoprim (BACTRIM DS,SEPTRA DS) 800-160 MG tablet The symptoms are most consistent with a folliculitis. I will therefore switch him to Septra. He had difficulty with doxycycline in the past interfering with his sleep. Hopefully this will take care of the problem.

## 2016-09-15 NOTE — Patient Instructions (Signed)
Claritin or Allegra during the day and Benadryl at night

## 2016-09-22 ENCOUNTER — Encounter: Payer: Self-pay | Admitting: Family Medicine

## 2016-09-24 ENCOUNTER — Other Ambulatory Visit: Payer: Self-pay | Admitting: Family Medicine

## 2016-09-24 DIAGNOSIS — L739 Follicular disorder, unspecified: Secondary | ICD-10-CM

## 2016-09-24 MED ORDER — SULFAMETHOXAZOLE-TRIMETHOPRIM 800-160 MG PO TABS: 1.0000 | ORAL_TABLET | Freq: Two times a day (BID) | ORAL | 0 refills | Status: DC

## 2016-10-01 ENCOUNTER — Encounter: Payer: Self-pay | Admitting: Family Medicine

## 2016-10-02 ENCOUNTER — Other Ambulatory Visit: Payer: Self-pay | Admitting: Family Medicine

## 2016-10-02 DIAGNOSIS — L739 Follicular disorder, unspecified: Secondary | ICD-10-CM

## 2016-10-02 MED ORDER — SULFAMETHOXAZOLE-TRIMETHOPRIM 800-160 MG PO TABS: 1.0000 | ORAL_TABLET | Freq: Two times a day (BID) | ORAL | 1 refills | Status: DC

## 2016-10-14 ENCOUNTER — Encounter: Payer: Self-pay | Admitting: Family Medicine

## 2016-10-14 MED ORDER — DOXYCYCLINE HYCLATE 100 MG PO TABS: 100.0000 mg | ORAL_TABLET | Freq: Two times a day (BID) | ORAL | 0 refills | Status: DC

## 2016-10-14 NOTE — Telephone Encounter (Signed)
He is still having difficulty with presumed infection in his leg. I will switch him to doxycycline as he is having difficulty with the Septra.

## 2016-10-22 ENCOUNTER — Other Ambulatory Visit: Payer: Self-pay

## 2016-10-22 ENCOUNTER — Encounter: Payer: Self-pay | Admitting: Family Medicine

## 2016-10-22 MED ORDER — VERAPAMIL HCL ER 360 MG PO CP24: ORAL_CAPSULE | ORAL | 3 refills | Status: DC

## 2016-10-22 MED ORDER — HYDROCHLOROTHIAZIDE 25 MG PO TABS: 25.0000 mg | ORAL_TABLET | Freq: Every day | ORAL | 3 refills | Status: DC

## 2016-10-23 ENCOUNTER — Encounter: Payer: Self-pay | Admitting: Family Medicine

## 2016-10-26 ENCOUNTER — Other Ambulatory Visit: Payer: Self-pay | Admitting: Dermatology

## 2016-11-02 DIAGNOSIS — C441192 Basal cell carcinoma of skin of left lower eyelid, including canthus: Secondary | ICD-10-CM | POA: Insufficient documentation

## 2016-11-24 ENCOUNTER — Telehealth: Payer: BC Managed Care – PPO | Admitting: Family

## 2016-11-24 DIAGNOSIS — B9689 Other specified bacterial agents as the cause of diseases classified elsewhere: Secondary | ICD-10-CM

## 2016-11-24 DIAGNOSIS — J019 Acute sinusitis, unspecified: Secondary | ICD-10-CM

## 2016-11-24 MED ORDER — AMOXICILLIN-POT CLAVULANATE 875-125 MG PO TABS: 1.0000 | ORAL_TABLET | Freq: Two times a day (BID) | ORAL | 0 refills | Status: DC

## 2016-11-24 NOTE — Progress Notes (Signed)

## 2016-11-27 ENCOUNTER — Encounter: Payer: Self-pay | Admitting: Family Medicine

## 2017-01-18 ENCOUNTER — Telehealth: Payer: Self-pay

## 2017-01-18 NOTE — Telephone Encounter (Signed)
Verapamil 360 mg is no longer available and pharmacy request alternative to be called in for this pt. [facility] [place]. [name]

## 2017-01-18 NOTE — Telephone Encounter (Signed)
Check on this and see if this is at another store

## 2017-01-19 ENCOUNTER — Other Ambulatory Visit: Payer: Self-pay

## 2017-01-19 MED ORDER — VERAPAMIL HCL ER 180 MG PO TBCR: 180.0000 mg | EXTENDED_RELEASE_TABLET | Freq: Two times a day (BID) | ORAL | 1 refills | Status: DC

## 2017-01-19 NOTE — Telephone Encounter (Signed)
Sent 180 mg bid per [employer]

## 2017-01-19 NOTE — Telephone Encounter (Signed)
Give him 2 of the 180 mg pills to be taken daily.

## 2017-01-19 NOTE — Telephone Encounter (Signed)
Pt informed of change

## 2017-01-19 NOTE — Telephone Encounter (Signed)
Called and talked with Pharmacist Verapamil 360 mg is no longer available as of last Friday they no longer make it

## 2017-07-16 ENCOUNTER — Other Ambulatory Visit: Payer: Self-pay | Admitting: Family Medicine

## 2017-07-16 NOTE — Telephone Encounter (Signed)
Pt due for appt scheduled 08/24/2017.

## 2017-08-24 ENCOUNTER — Ambulatory Visit: Payer: BC Managed Care – PPO | Admitting: Family Medicine

## 2017-08-24 ENCOUNTER — Encounter: Payer: Self-pay | Admitting: Family Medicine

## 2017-08-24 VITALS — BP 130/80 | HR 77 | Resp 18 | Ht 66.0 in | Wt 354.0 lb

## 2017-08-24 DIAGNOSIS — I10 Essential (primary) hypertension: Secondary | ICD-10-CM

## 2017-08-24 DIAGNOSIS — R7302 Impaired glucose tolerance (oral): Secondary | ICD-10-CM | POA: Diagnosis not present

## 2017-08-24 DIAGNOSIS — Z23 Encounter for immunization: Secondary | ICD-10-CM | POA: Diagnosis not present

## 2017-08-24 DIAGNOSIS — J309 Allergic rhinitis, unspecified: Secondary | ICD-10-CM

## 2017-08-24 DIAGNOSIS — Z1159 Encounter for screening for other viral diseases: Secondary | ICD-10-CM | POA: Diagnosis not present

## 2017-08-24 DIAGNOSIS — C441192 Basal cell carcinoma of skin of left lower eyelid, including canthus: Secondary | ICD-10-CM

## 2017-08-24 DIAGNOSIS — M25551 Pain in right hip: Secondary | ICD-10-CM | POA: Diagnosis not present

## 2017-08-24 LAB — POCT GLYCOSYLATED HEMOGLOBIN (HGB A1C): Hemoglobin A1C: 5.8

## 2017-08-24 NOTE — Progress Notes (Signed)
   Subjective:    Patient ID: Jeremy Ward, male    DOB: Jul 20, 1956, 61 y.o.   MRN: [id number]  HPI He is here for an interval evaluation.  He continues on his verapamil.  He is also taking HCTZ.  He has no complaints concerning that.  He is not exercising in spite of multiple attempts in the past to get him to be more active.  His allergies seem to be under good control.  He has had a hip replacement but still complains of hip pain.  He also has some arthritic joints but is holding off on any further intervention especially with his weight.  He has a previous history of glucose intolerance.  He also recently had a basal cell carcinoma removed from his left eyelid.  His work and home life are going well.  He is contemplating retirement in several years.  He had no chest pain, shortness of breath, GI issues.   Review of Systems     Objective:   Physical Exam Alert and in no distress. Tympanic membranes and canals are normal. Pharyngeal area is normal. Neck is supple without adenopathy or thyromegaly. Cardiac exam shows a regular sinus rhythm without murmurs or gallops. Lungs are clear to auscultation. A1c is 5.8       Assessment & Plan:  Essential hypertension - Plan: CBC with Differential/Platelet, Comprehensive metabolic panel  Need for influenza vaccination - Plan: Varicella-zoster vaccine IM (Shingrix)  Need for Tdap vaccination - Plan: Tdap vaccine greater than or equal to 7yo IM  Morbid obesity ([place]) - Plan: CBC with Differential/Platelet, Comprehensive metabolic panel, Lipid panel  Allergic rhinitis, unspecified seasonality, unspecified trigger  Right hip pain  Basal cell carcinoma of lower eyelid, left  Glucose intolerance (impaired glucose tolerance) - Plan: HgB A1c  Need for hepatitis C screening test - Plan: Hepatitis C antibody  His immunizations will be updated.  I did not discuss weight loss with him as we have discussed this in the past.  Did discuss his risk  for diabetes in regard to his A1c.  He will continue to be followed by orthopedics for his hip and knee pain.

## 2017-08-25 LAB — CBC WITH DIFFERENTIAL/PLATELET
BASOS PCT: 0.8 %
Basophils Absolute: 57 cells/uL (ref 0–200)
EOS PCT: 2.4 %
Eosinophils Absolute: 170 cells/uL (ref 15–500)
HCT: 42.4 % (ref 38.5–50.0)
Hemoglobin: 15.1 g/dL (ref 13.2–17.1)
LYMPHS ABS: 1789 {cells}/uL (ref 850–3900)
MCH: 30.1 pg (ref 27.0–33.0)
MCHC: 35.6 g/dL (ref 32.0–36.0)
MCV: 84.5 fL (ref 80.0–100.0)
MPV: 10.2 fL (ref 7.5–12.5)
Monocytes Relative: 7.2 %
Neutro Abs: 4572 cells/uL (ref 1500–7800)
Neutrophils Relative %: 64.4 %
Platelets: 214 10*3/uL (ref 140–400)
RBC: 5.02 10*6/uL (ref 4.20–5.80)
RDW: 13.7 % (ref 11.0–15.0)
Total Lymphocyte: 25.2 %
WBC: 7.1 10*3/uL (ref 3.8–10.8)
WBCMIX: 511 {cells}/uL (ref 200–950)

## 2017-08-25 LAB — COMPREHENSIVE METABOLIC PANEL
AG RATIO: 1.6 (calc) (ref 1.0–2.5)
ALT: 14 U/L (ref 9–46)
AST: 17 U/L (ref 10–35)
Albumin: 4.1 g/dL (ref 3.6–5.1)
Alkaline phosphatase (APISO): 65 U/L (ref 40–115)
BUN: 18 mg/dL (ref 7–25)
CO2: 25 mmol/L (ref 20–32)
Calcium: 9.3 mg/dL (ref 8.6–10.3)
Chloride: 100 mmol/L (ref 98–110)
Creat: 1.21 mg/dL (ref 0.70–1.25)
GLOBULIN: 2.5 g/dL (ref 1.9–3.7)
Glucose, Bld: 111 mg/dL — ABNORMAL HIGH (ref 65–99)
POTASSIUM: 3 mmol/L — AB (ref 3.5–5.3)
Sodium: 140 mmol/L (ref 135–146)
Total Bilirubin: 0.5 mg/dL (ref 0.2–1.2)
Total Protein: 6.6 g/dL (ref 6.1–8.1)

## 2017-08-25 LAB — LIPID PANEL
Cholesterol: 209 mg/dL — ABNORMAL HIGH (ref <200)
HDL: 52 mg/dL (ref >40)
LDL CHOLESTEROL (CALC): 129 mg/dL — AB
Non-HDL Cholesterol (Calc): 157 mg/dL (calc) — ABNORMAL HIGH (ref <130)
TRIGLYCERIDES: 167 mg/dL — AB (ref <150)
Total CHOL/HDL Ratio: 4 (calc) (ref <5.0)

## 2017-08-25 LAB — HEPATITIS C ANTIBODY
Hepatitis C Ab: NONREACTIVE
SIGNAL TO CUT-OFF: 0.01 (ref <1.00)

## 2017-10-10 ENCOUNTER — Other Ambulatory Visit: Payer: Self-pay | Admitting: Family Medicine

## 2017-11-23 ENCOUNTER — Telehealth: Payer: BC Managed Care – PPO | Admitting: Family

## 2017-11-23 DIAGNOSIS — J019 Acute sinusitis, unspecified: Secondary | ICD-10-CM

## 2017-11-23 DIAGNOSIS — B9689 Other specified bacterial agents as the cause of diseases classified elsewhere: Secondary | ICD-10-CM | POA: Diagnosis not present

## 2017-11-23 MED ORDER — AMOXICILLIN-POT CLAVULANATE 875-125 MG PO TABS: 1.0000 | ORAL_TABLET | Freq: Two times a day (BID) | ORAL | 0 refills | Status: DC

## 2017-11-23 NOTE — Progress Notes (Signed)

## 2017-12-07 ENCOUNTER — Telehealth: Payer: Self-pay | Admitting: Internal Medicine

## 2017-12-07 NOTE — Telephone Encounter (Signed)
Left message for pt to call back to schedule a 2nd shringrix

## 2017-12-10 ENCOUNTER — Encounter: Payer: Self-pay | Admitting: Family Medicine

## 2017-12-10 ENCOUNTER — Telehealth: Payer: BC Managed Care – PPO | Admitting: Family

## 2017-12-10 DIAGNOSIS — B9789 Other viral agents as the cause of diseases classified elsewhere: Secondary | ICD-10-CM

## 2017-12-10 DIAGNOSIS — J329 Chronic sinusitis, unspecified: Secondary | ICD-10-CM

## 2017-12-10 MED ORDER — FLUTICASONE PROPIONATE 50 MCG/ACT NA SUSP: 2.0000 | Freq: Every day | NASAL | 6 refills | Status: DC

## 2017-12-10 NOTE — Progress Notes (Signed)
Thank you for the details you included in the comment boxes. Those details are very helpful in determining the best course of treatment for you and help [place] to provide the best care. If you have urinary concerns, this needs to be another e-visit for [place] to have the proper treatment evaluation and plan due to the streamlined approach of the e-visit program.   We are sorry that you are not feeling well.  Here is how we plan to help!  Based on what you have shared with me it looks like you have sinusitis.  Sinusitis is inflammation and infection in the sinus cavities of the head.  Based on your presentation I believe you most likely have Acute Viral Sinusitis.This is an infection most likely caused by a virus. There is not specific treatment for viral sinusitis other than to help you with the symptoms until the infection runs its course.  You may use an oral decongestant such as Mucinex D or if you have glaucoma or high blood pressure use plain Mucinex. Saline nasal spray help and can safely be used as often as needed for congestion, I have prescribed: Fluticasone nasal spray two sprays in each nostril once a day  Some authorities believe that zinc sprays or the use of Echinacea may shorten the course of your symptoms.  Sinus infections are not as easily transmitted as other respiratory infection, however we still recommend that you avoid close contact with loved ones, especially the very young and elderly.  Remember to wash your hands thoroughly throughout the day as this is the number one way to prevent the spread of infection!  Home Care:  Only take medications as instructed by your medical team.  Do not take these medications with alcohol.  A steam or ultrasonic humidifier can help congestion.  You can place a towel over your head and breathe in the steam from hot water coming from a faucet.  Avoid close contacts especially the very young and the elderly.  Cover your mouth when you cough or  sneeze.  Always remember to wash your hands.  Get Help Right Away If:  You develop worsening fever or sinus pain.  You develop a severe head ache or visual changes.  Your symptoms persist after you have completed your treatment plan.  Make sure you  Understand these instructions.  Will watch your condition.  Will get help right away if you are not doing well or get worse.  Your e-visit answers were reviewed by a board certified advanced clinical practitioner to complete your personal care plan.  Depending on the condition, your plan could have included both over the counter or prescription medications.  If there is a problem please reply  once you have received a response from your provider.  Your safety is important to [place].  If you have drug allergies check your prescription carefully.    You can use MyChart to ask questions about today's visit, request a non-urgent call back, or ask for a work or school excuse for 24 hours related to this e-Visit. If it has been greater than 24 hours you will need to follow up with your provider, or enter a new e-Visit to address those concerns.  You will get an e-mail in the next two days asking about your experience.  I hope that your e-visit has been valuable and will speed your recovery. Thank you for using e-visits.

## 2017-12-31 ENCOUNTER — Other Ambulatory Visit: Payer: Self-pay | Admitting: Family Medicine

## 2018-03-30 ENCOUNTER — Telehealth: Payer: Self-pay | Admitting: Family Medicine

## 2018-03-30 NOTE — Telephone Encounter (Signed)
Pt was advised and says he will ask any question tomorrow when he sees you. [place]

## 2018-03-30 NOTE — Telephone Encounter (Signed)
Pt called and had concerns about the measles. He is pretty sure he had it as a kid. 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 Pt would like to know if he should do anything? Please advise pt at or [phone] or can message him thru mychart.

## 2018-03-30 NOTE — Telephone Encounter (Signed)
Very good bet that he had it as a kid and is now immune.  If he has any questions we can always do a blood study.

## 2018-03-31 ENCOUNTER — Encounter: Payer: Self-pay | Admitting: Family Medicine

## 2018-03-31 ENCOUNTER — Other Ambulatory Visit: Payer: BC Managed Care – PPO

## 2018-03-31 ENCOUNTER — Telehealth: Payer: Self-pay | Admitting: *Deleted

## 2018-03-31 ENCOUNTER — Other Ambulatory Visit: Payer: Self-pay

## 2018-03-31 ENCOUNTER — Ambulatory Visit: Payer: BC Managed Care – PPO | Admitting: Family Medicine

## 2018-03-31 VITALS — BP 148/88 | HR 75 | Temp 97.9°F | Wt 350.6 lb

## 2018-03-31 DIAGNOSIS — Z23 Encounter for immunization: Secondary | ICD-10-CM | POA: Diagnosis not present

## 2018-03-31 DIAGNOSIS — L03113 Cellulitis of right upper limb: Secondary | ICD-10-CM | POA: Diagnosis not present

## 2018-03-31 DIAGNOSIS — E876 Hypokalemia: Secondary | ICD-10-CM | POA: Diagnosis not present

## 2018-03-31 MED ORDER — DOXYCYCLINE HYCLATE 100 MG PO TABS: 100.0000 mg | ORAL_TABLET | Freq: Two times a day (BID) | ORAL | 0 refills | Status: DC

## 2018-03-31 MED ORDER — POTASSIUM CHLORIDE CRYS ER 20 MEQ PO TBCR: 20.0000 meq | EXTENDED_RELEASE_TABLET | Freq: Every day | ORAL | 3 refills | Status: DC

## 2018-03-31 MED ORDER — VERAPAMIL HCL ER 180 MG PO TBCR: 180.0000 mg | EXTENDED_RELEASE_TABLET | Freq: Two times a day (BID) | ORAL | 3 refills | Status: DC

## 2018-03-31 NOTE — Telephone Encounter (Signed)
Patient was in for acute visit today and stated that [facility] He was last seen for med check in Nov, I told him may be due for med check and I would call him and let him know. Please advise.

## 2018-03-31 NOTE — Progress Notes (Signed)
  Subjective:     Patient ID: Jeremy Ward, male   DOB: 1955/12/11, 62 y.o.   MRN: [id number]  HPI He presents with redness and warmth on right arm since yesterday. States he noticed a hard spot in same area 1 week ago and the redness has gotten bigger. Reports pain when applying pressure and itching. Denies fevers, shortness of breath or chills, sick contacts or recent insect (tick) bites. States his cat scratches his arms but doesn't think its related. Concerned about Voltaren gel  getting onto his arm from his hands. Used a skin moisturizer which made it worse.Had never had this before.    Also here for his second shingles shot. No problems with first injection.  Concerned regarding measles, thinks he had it in the past as a child. No known exposure.  Concerned regarding being on K+ since he is on hydrodiuril. States he has been on it in the past.  Last K+ was 3.0 in November of 2018  Review of Systems     Objective:   Physical Exam  Erythematous raised area in a circular pattern on R forearm. Multiple linear scabs on L forearm.  Is roughly 6 x 8 inches and has a target lesion appearance to it.  There is central clearing.    Assessment:    Cellulitis of right upper extremity - Plan: doxycycline (VIBRA-TABS) 100 MG tablet  Hypokalemia - Plan: potassium chloride SA (K-DUR,KLOR-CON) 20 MEQ tablet  Need for shingles vaccine - Plan: Varicella-zoster vaccine IM (Shingrix)      Plan:     the rash has a target appearance to it with central clearing which makes me think this is Lyme disease but he is really not having any other symptoms.  I will go ahead and treat with doxycycline.  Did have him take a picture of it.  He is to call Monday and let me know how he is doing   Patient seen and evaluated in conjunction with [facility]

## 2018-04-05 ENCOUNTER — Other Ambulatory Visit: Payer: Self-pay | Admitting: Family Medicine

## 2018-04-17 ENCOUNTER — Other Ambulatory Visit: Payer: Self-pay | Admitting: Family Medicine

## 2018-04-17 DIAGNOSIS — L03113 Cellulitis of right upper limb: Secondary | ICD-10-CM

## 2018-04-18 ENCOUNTER — Other Ambulatory Visit: Payer: Self-pay | Admitting: Medical

## 2018-04-18 MED ORDER — DOXYCYCLINE HYCLATE 100 MG PO TABS: 100.0000 mg | ORAL_TABLET | Freq: Two times a day (BID) | ORAL | 0 refills | Status: DC

## 2018-04-18 NOTE — Telephone Encounter (Signed)
I got refill request on Doxycycline.   What does he use this for?

## 2018-04-18 NOTE — Telephone Encounter (Signed)
[facility] Please advise [facility]

## 2018-04-18 NOTE — Telephone Encounter (Signed)
Cellulitis on his left shin.

## 2018-04-21 ENCOUNTER — Encounter: Payer: Self-pay | Admitting: Family Medicine

## 2018-04-25 ENCOUNTER — Telehealth: Payer: Self-pay | Admitting: Family Medicine

## 2018-04-25 DIAGNOSIS — L03113 Cellulitis of right upper limb: Secondary | ICD-10-CM

## 2018-04-25 MED ORDER — DOXYCYCLINE HYCLATE 100 MG PO TABS: 100.0000 mg | ORAL_TABLET | Freq: Two times a day (BID) | ORAL | 0 refills | Status: DC

## 2018-04-25 NOTE — Telephone Encounter (Signed)
Pt called and is requesting another round of antibiotic in for his cellulitis of his leg states it is much better just not completely healed, it is itching right now, pt was taking Doxycycline, [id number] [place] [place] - [address] pt can be reached at [phone]

## 2018-06-14 ENCOUNTER — Telehealth: Payer: BC Managed Care – PPO | Admitting: Family

## 2018-06-14 DIAGNOSIS — J019 Acute sinusitis, unspecified: Secondary | ICD-10-CM

## 2018-06-14 DIAGNOSIS — B9689 Other specified bacterial agents as the cause of diseases classified elsewhere: Secondary | ICD-10-CM

## 2018-06-14 MED ORDER — AMOXICILLIN-POT CLAVULANATE 875-125 MG PO TABS: 1.0000 | ORAL_TABLET | Freq: Two times a day (BID) | ORAL | 0 refills | Status: DC

## 2018-06-14 NOTE — Progress Notes (Signed)

## 2018-06-26 ENCOUNTER — Other Ambulatory Visit: Payer: Self-pay | Admitting: Family Medicine

## 2018-09-16 ENCOUNTER — Other Ambulatory Visit: Payer: Self-pay | Admitting: Family Medicine

## 2018-09-18 ENCOUNTER — Other Ambulatory Visit: Payer: Self-pay | Admitting: Family Medicine

## 2018-09-19 ENCOUNTER — Telehealth: Payer: Self-pay

## 2018-09-19 NOTE — Telephone Encounter (Signed)
Called pt to advise a pt he needs a med check or CPE appt. Pt has not had labs in a year. [place]

## 2018-10-05 ENCOUNTER — Telehealth: Payer: BC Managed Care – PPO | Admitting: Family

## 2018-10-05 DIAGNOSIS — J019 Acute sinusitis, unspecified: Secondary | ICD-10-CM

## 2018-10-05 MED ORDER — AMOXICILLIN-POT CLAVULANATE 875-125 MG PO TABS: 1.0000 | ORAL_TABLET | Freq: Two times a day (BID) | ORAL | 0 refills | Status: DC

## 2018-10-05 NOTE — Progress Notes (Signed)

## 2018-10-26 ENCOUNTER — Telehealth: Payer: BC Managed Care – PPO | Admitting: Family

## 2018-10-26 DIAGNOSIS — J0191 Acute recurrent sinusitis, unspecified: Secondary | ICD-10-CM

## 2018-10-26 NOTE — Progress Notes (Signed)
Based on what you shared with me it looks like you have a serious condition that should be evaluated in a face to face office visit.  NOTE: If you entered your credit card information for this eVisit, you will not be charged. You may see a "hold" on your card for the $30 but that hold will drop off and you will not have a charge processed.  Since you have been treated for this and your symptoms did not improve, I think it would be best to be evaluated face-to-face. Hope you feel better soon!  If you are having a true medical emergency please call 911.  If you need an urgent face to face visit, [place] has four urgent care centers for your convenience.  If you need care fast and have a high deductible or no insurance consider:   [email or web address] to reserve your spot online an avoid wait times  [facility] [address], [address] [place], [place] [address] 8 am to 8 pm Monday-Friday 10 am to 4 pm Saturday-Sunday *Across the street from [employer]  [place], [address] 8 am to 5 pm Monday-Friday * In the [facility] on the [facility]   The following sites will take your  insurance:  . [facility]gent [place] a Provider at this Location  [address] [place], [place] [address] . 10 am to 8 pm Monday-Friday . 12 pm to 8 pm Saturday-Sunday   . [facility] at [place] a Provider at this Location  [address], [place], [place] [address] . 8 am to 8 pm Monday-Friday . 9 am to 6 pm Saturday . 11 am to 6 pm Sunday   . [facility] at [place] Get Driving Directions  [address] Suite [place],  [address] . 8 am to 8 pm Monday-Friday . 8 am to 4 pm Saturday-Sunday   Your e-visit answers were reviewed by a board  certified advanced clinical practitioner to complete your personal care plan.  Thank you for using e-Visits.

## 2018-11-02 ENCOUNTER — Ambulatory Visit: Payer: BC Managed Care – PPO | Admitting: Family Medicine

## 2018-11-02 ENCOUNTER — Encounter: Payer: Self-pay | Admitting: Family Medicine

## 2018-11-02 VITALS — BP 136/84 | HR 80 | Temp 98.1°F | Wt 348.2 lb

## 2018-11-02 DIAGNOSIS — J0191 Acute recurrent sinusitis, unspecified: Secondary | ICD-10-CM | POA: Diagnosis not present

## 2018-11-02 MED ORDER — AMOXICILLIN-POT CLAVULANATE 875-125 MG PO TABS: 1.0000 | ORAL_TABLET | Freq: Two times a day (BID) | ORAL | 0 refills | Status: DC

## 2018-11-02 MED ORDER — PREDNISONE 10 MG (48) PO TBPK: ORAL_TABLET | ORAL | 0 refills | Status: DC

## 2018-11-02 NOTE — Progress Notes (Signed)
   Subjective:    Patient ID: Jeremy Ward, male    DOB: [birth year], 63 y.o.   MRN: [id number]  HPI He was seen in an E visit January 1 and treated for sinusitis with Augmentin for 7 days.  He states that he got roughly 50% better but within the last week he has noted left earache, sinus pressure especially on the left with PND, upper tooth discomfort on the left, chills but no sore throat or fever.  Review of Systems     Objective:   Physical Exam Alert and in no distress.  Nasal mucosa is normal.  Tender over all sinuses especially left maxillary sinus.  Tympanic membranes and canals are normal. Pharyngeal area is normal. Neck is supple without adenopathy or thyromegaly. Cardiac exam shows a regular sinus rhythm without murmurs or gallops. Lungs are clear to auscultation.        Assessment & Plan:  Acute recurrent sinusitis, unspecified location - Plan: amoxicillin-clavulanate (AUGMENTIN) 875-125 MG tablet, predniSONE (STERAPRED UNI-PAK 48 TAB) 10 MG (48) TBPK tablet  He is to call if not entirely back to normal when he finishes the steroids and the antibiotic.

## 2018-11-02 NOTE — Patient Instructions (Signed)
Take all the antibiotic and if not totally back to normal when you finish give me a call 

## 2018-11-05 ENCOUNTER — Encounter: Payer: Self-pay | Admitting: Family Medicine

## 2018-12-01 ENCOUNTER — Encounter: Payer: Self-pay | Admitting: Family Medicine

## 2018-12-01 DIAGNOSIS — L03113 Cellulitis of right upper limb: Secondary | ICD-10-CM

## 2018-12-01 MED ORDER — DOXYCYCLINE HYCLATE 100 MG PO TABS: 100.0000 mg | ORAL_TABLET | Freq: Two times a day (BID) | ORAL | 0 refills | Status: DC

## 2018-12-11 ENCOUNTER — Encounter: Payer: Self-pay | Admitting: Family Medicine

## 2018-12-11 DIAGNOSIS — L03113 Cellulitis of right upper limb: Secondary | ICD-10-CM

## 2018-12-12 ENCOUNTER — Other Ambulatory Visit: Payer: Self-pay | Admitting: Family Medicine

## 2018-12-12 ENCOUNTER — Encounter: Payer: Self-pay | Admitting: Family Medicine

## 2018-12-12 MED ORDER — DOXYCYCLINE HYCLATE 100 MG PO TABS: 100.0000 mg | ORAL_TABLET | Freq: Two times a day (BID) | ORAL | 0 refills | Status: DC

## 2018-12-15 ENCOUNTER — Encounter: Payer: Self-pay | Admitting: Family Medicine

## 2018-12-16 ENCOUNTER — Ambulatory Visit: Payer: BC Managed Care – PPO | Admitting: Medical

## 2018-12-16 ENCOUNTER — Encounter: Payer: Self-pay | Admitting: Medical

## 2018-12-16 ENCOUNTER — Other Ambulatory Visit: Payer: Self-pay

## 2018-12-16 VITALS — BP 136/80 | HR 92 | Temp 98.0°F | Resp 18 | Wt 361.2 lb

## 2018-12-16 DIAGNOSIS — I10 Essential (primary) hypertension: Secondary | ICD-10-CM | POA: Diagnosis not present

## 2018-12-16 DIAGNOSIS — L03119 Cellulitis of unspecified part of limb: Secondary | ICD-10-CM

## 2018-12-16 DIAGNOSIS — L299 Pruritus, unspecified: Secondary | ICD-10-CM

## 2018-12-16 DIAGNOSIS — R7302 Impaired glucose tolerance (oral): Secondary | ICD-10-CM | POA: Diagnosis not present

## 2018-12-16 MED ORDER — CEFTRIAXONE SODIUM 500 MG IJ SOLR
500.0000 mg | Freq: Once | INTRAMUSCULAR | Status: AC
  Administered 2018-12-16: 500 mg via INTRAMUSCULAR

## 2018-12-16 MED ORDER — CEPHALEXIN 500 MG PO CAPS: 500.0000 mg | ORAL_CAPSULE | Freq: Three times a day (TID) | ORAL | 0 refills | Status: DC

## 2018-12-16 MED ORDER — HYDROXYZINE HCL 10 MG PO TABS: 10.0000 mg | ORAL_TABLET | Freq: Three times a day (TID) | ORAL | 0 refills | Status: DC | PRN

## 2018-12-16 NOTE — Progress Notes (Signed)
Subjective:     Patient ID: Jeremy Ward, male   DOB: October 08, 1955, 63 y.o.   MRN: [id number]  HPI Chief Complaint  Patient presents with  . cellulitis    cellulitis advised by Jeremy Ward to be seen    Here for cellulites bilat lower legs.  Has had this before.   Started over 2 weeks ago.   Called Jeremy Ward when first symptoms began.  Started Doxycycline 2 weeks ago, started a second round of Doxycyline, and on 4th day of 2nd round of Doxycycline, but not improving.   Has subjective intermittent fever.   No NVD.  No SOB.  No numbness, but has some tingling in legs.   No other aggravating or relieving factors. No other complaint.  Past Medical History:  Diagnosis Date  . Allergy    RHINITIS  . Arthritis    HIP  . Diverticulosis   . Hypertension   . Obesity, morbid (more than 100 lbs over ideal weight or BMI > 40) (HCC)    Current Outpatient Medications on File Prior to Visit  Medication Sig Dispense Refill  . acetaminophen (TYLENOL) 500 MG tablet Take 1,000 mg by mouth every morning.    . Cyanocobalamin (VITAMIN B 12 PO) Take by mouth.    . diclofenac (VOLTAREN) 75 MG EC tablet Take 1 tablet (75 mg total) by mouth 2 (two) times daily. 30 tablet 0  . diclofenac sodium (VOLTAREN) 1 % GEL Apply 2 g topically 4 (four) times daily.    . hydrochlorothiazide (HYDRODIURIL) 25 MG tablet TAKE 1 TABLET BY MOUTH EVERY DAY 90 tablet 3  . potassium chloride SA (K-DUR,KLOR-CON) 20 MEQ tablet Take 1 tablet (20 mEq total) by mouth daily. 90 tablet 3  . verapamil (CALAN-SR) 180 MG CR tablet Take 1 tablet (180 mg total) by mouth 2 (two) times daily. 180 tablet 3  . fluticasone (FLONASE) 50 MCG/ACT nasal spray Place 2 sprays into both nostrils daily. (Patient not taking: Reported on 11/02/2018) 16 g 6  . triamcinolone (NASACORT ALLERGY 24HR) 55 MCG/ACT AERO nasal inhaler Place 2 sprays into the nose daily. Reported on 12/06/2015 (Patient not taking: Reported on 03/31/2018) 1 Inhaler 11   No current  facility-administered medications on file prior to visit.     Review of Systems As in subjective     Objective:   Physical Exam BP 136/80   Pulse 92   Temp 98 F (36.7 C) (Oral)   Resp 18   Wt (!) 361 lb 3.2 oz (163.8 kg)   SpO2 94%   BMI 58.30 kg/m   Wt Readings from Last 3 Encounters:  12/16/18 (!) 361 lb 3.2 oz (163.8 kg)  11/02/18 (!) 348 lb 3.2 oz (157.9 kg)  03/31/18 (!) 350 lb 9.6 oz (159 kg)   Gen: obese white male, nad Bilateral lower legs with swelling and erythema for the distal two thirds of the legs, some weeping of the right lower leg, 1+ pitting edema bilaterally 1+ pedal pulses bilateral Lungs clear Heart RRR, normal S1-S2, no murmurs     Assessment:     Encounter Diagnoses  Name Primary?  . Cellulitis of lower extremity, unspecified laterality Yes  . Morbid obesity ([place])   . Essential hypertension   . Glucose intolerance (impaired glucose tolerance)   . Itching        Plan:     We discussed his symptoms and exam findings.  Begin Keflex.  Gave a shot of Rocephin at his request to jumpstart  treatment since doxycycline is not helping.  Labs today for further evaluation.  Advised leg elevation, cool compresses at night since the itching is driving him crazy, avoid salt which would make him retain more fluid.  Advised if any worse over the weekend particular with fever or vomiting or nausea to call after-hours line or go to the hospital  He voices understanding and agreement of plan  Jeremy Ward was seen today for cellulitis.  Diagnoses and all orders for this visit:  Cellulitis of lower extremity, unspecified laterality -     Comprehensive metabolic panel -     CBC with Differential/Platelet -     cefTRIAXone (ROCEPHIN) injection 500 mg  Morbid obesity (HCC)  Essential hypertension -     Comprehensive metabolic panel  Glucose intolerance (impaired glucose tolerance) -     Hemoglobin A1c  Itching  Other orders -     cephALEXin (KEFLEX) 500  MG capsule; Take 1 capsule (500 mg total) by mouth 3 (three) times daily. -     hydrOXYzine (ATARAX/VISTARIL) 10 MG tablet; Take 1 tablet (10 mg total) by mouth 3 (three) times daily as needed.   Recheck next week with Jeremy Ward

## 2018-12-17 LAB — CBC WITH DIFFERENTIAL/PLATELET
Basophils Absolute: 0.1 10*3/uL (ref 0.0–0.2)
Basos: 1 %
EOS (ABSOLUTE): 0.2 10*3/uL (ref 0.0–0.4)
Eos: 2 %
Hematocrit: 43.3 % (ref 37.5–51.0)
Hemoglobin: 14.6 g/dL (ref 13.0–17.7)
Immature Grans (Abs): 0 10*3/uL (ref 0.0–0.1)
Immature Granulocytes: 1 %
Lymphocytes Absolute: 1.5 10*3/uL (ref 0.7–3.1)
Lymphs: 21 %
MCH: 30 pg (ref 26.6–33.0)
MCHC: 33.7 g/dL (ref 31.5–35.7)
MCV: 89 fL (ref 79–97)
Monocytes Absolute: 0.6 10*3/uL (ref 0.1–0.9)
Monocytes: 9 %
Neutrophils Absolute: 4.9 10*3/uL (ref 1.4–7.0)
Neutrophils: 66 %
Platelets: 228 10*3/uL (ref 150–450)
RBC: 4.87 x10E6/uL (ref 4.14–5.80)
RDW: 14.2 % (ref 11.6–15.4)
WBC: 7.3 10*3/uL (ref 3.4–10.8)

## 2018-12-17 LAB — COMPREHENSIVE METABOLIC PANEL
ALT: 14 IU/L (ref 0–44)
AST: 17 IU/L (ref 0–40)
Albumin/Globulin Ratio: 2 (ref 1.2–2.2)
Albumin: 4.3 g/dL (ref 3.8–4.8)
Alkaline Phosphatase: 57 IU/L (ref 39–117)
BUN/Creatinine Ratio: 19 (ref 10–24)
BUN: 24 mg/dL (ref 8–27)
Bilirubin Total: 0.4 mg/dL (ref 0.0–1.2)
CO2: 26 mmol/L (ref 20–29)
Calcium: 9.2 mg/dL (ref 8.6–10.2)
Chloride: 97 mmol/L (ref 96–106)
Creatinine, Ser: 1.24 mg/dL (ref 0.76–1.27)
GFR calc non Af Amer: 62 mL/min/{1.73_m2} (ref >59)
GFR, EST AFRICAN AMERICAN: 72 mL/min/{1.73_m2} (ref >59)
Globulin, Total: 2.1 g/dL (ref 1.5–4.5)
Glucose: 94 mg/dL (ref 65–99)
Potassium: 3.3 mmol/L — ABNORMAL LOW (ref 3.5–5.2)
Sodium: 142 mmol/L (ref 134–144)
Total Protein: 6.4 g/dL (ref 6.0–8.5)

## 2018-12-17 LAB — HEMOGLOBIN A1C
Est. average glucose Bld gHb Est-mCnc: 120 mg/dL
Hgb A1c MFr Bld: 5.8 % — ABNORMAL HIGH (ref 4.8–5.6)

## 2018-12-18 ENCOUNTER — Encounter: Payer: Self-pay | Admitting: Family Medicine

## 2018-12-20 ENCOUNTER — Encounter: Payer: Self-pay | Admitting: Family Medicine

## 2018-12-22 ENCOUNTER — Ambulatory Visit: Payer: BC Managed Care – PPO | Admitting: Family Medicine

## 2018-12-22 ENCOUNTER — Encounter: Payer: Self-pay | Admitting: Family Medicine

## 2018-12-22 ENCOUNTER — Other Ambulatory Visit: Payer: Self-pay

## 2018-12-22 VITALS — BP 128/80 | HR 81 | Temp 97.9°F | Wt 359.8 lb

## 2018-12-22 DIAGNOSIS — L299 Pruritus, unspecified: Secondary | ICD-10-CM | POA: Diagnosis not present

## 2018-12-22 DIAGNOSIS — L03119 Cellulitis of unspecified part of limb: Secondary | ICD-10-CM

## 2018-12-22 MED ORDER — HYDROXYZINE HCL 50 MG PO TABS: 50.0000 mg | ORAL_TABLET | Freq: Three times a day (TID) | ORAL | 0 refills | Status: DC | PRN

## 2018-12-22 NOTE — Progress Notes (Signed)
   Subjective:    Patient ID: Jeremy Ward, male    DOB: [birth year], 63 y.o.   MRN: [id number]  HPI He is here for a recheck.  He was switched to Keflex on his last visit but states that he is still having swelling, itching and discomfort in his lower extremities.  He has been using hydroxyzine 20 mg 3 times per day without relief of his itching.  He tried Benadryl but could not tolerate it.   Review of Systems     Objective:   Physical Exam Alert and in no distress.  Exam of lower extremities does show reddish-brown skin is slightly tender to palpation, not hot.       Assessment & Plan:  Cellulitis of lower extremity, unspecified laterality  Pruritus - Plan: hydrOXYzine (ATARAX/VISTARIL) 50 MG tablet I discussed options with him.  We will initially increase his Keflex to 4 times per day and switch him to a stronger hydroxyzine.  He is also to take Tagamet OTC twice per day.  He will call me Monday and I will possibly switch him to a different antibiotic and consider using an SSRI to help with the itching.

## 2018-12-22 NOTE — Patient Instructions (Addendum)
Take over-the-counter Tagamet twice per day and call me Monday Take the Keflex 4 times per day

## 2018-12-23 ENCOUNTER — Other Ambulatory Visit: Payer: Self-pay | Admitting: Medical

## 2018-12-24 ENCOUNTER — Encounter: Payer: Self-pay | Admitting: Family Medicine

## 2018-12-26 ENCOUNTER — Telehealth: Payer: Self-pay | Admitting: Family Medicine

## 2018-12-26 DIAGNOSIS — L299 Pruritus, unspecified: Secondary | ICD-10-CM

## 2018-12-26 MED ORDER — HYDROXYZINE HCL 50 MG PO TABS: 50.0000 mg | ORAL_TABLET | Freq: Three times a day (TID) | ORAL | 0 refills | Status: DC | PRN

## 2018-12-26 MED ORDER — CEPHALEXIN 500 MG PO CAPS: 500.0000 mg | ORAL_CAPSULE | Freq: Four times a day (QID) | ORAL | 0 refills | Status: DC

## 2018-12-26 NOTE — Telephone Encounter (Signed)
Called pt and informed

## 2018-12-26 NOTE — Telephone Encounter (Signed)
Is this ok to refill?  

## 2018-12-26 NOTE — Telephone Encounter (Signed)
Pt called and states that he needs a refill on his keflex states it was never sent the pharmacy, pt also states the hydroxyzine was never sent in either pt also states he now has a yeast infection and is itching in his private area, he also sent you a my chart message pt [facility]   [id number] [place] [place] - [address]

## 2018-12-26 NOTE — Telephone Encounter (Signed)
Let him know that the drugstore is extremely slow and I did call it in.

## 2019-01-02 ENCOUNTER — Encounter: Payer: Self-pay | Admitting: Family Medicine

## 2019-01-02 MED ORDER — CEPHALEXIN 500 MG PO CAPS: 500.0000 mg | ORAL_CAPSULE | Freq: Four times a day (QID) | ORAL | 0 refills | Status: DC

## 2019-01-04 ENCOUNTER — Other Ambulatory Visit: Payer: Self-pay | Admitting: Family Medicine

## 2019-01-08 ENCOUNTER — Encounter: Payer: Self-pay | Admitting: Family Medicine

## 2019-01-09 MED ORDER — SULFAMETHOXAZOLE-TRIMETHOPRIM 800-160 MG PO TABS: 1.0000 | ORAL_TABLET | Freq: Two times a day (BID) | ORAL | 0 refills | Status: DC

## 2019-02-03 ENCOUNTER — Encounter: Payer: Self-pay | Admitting: Family Medicine

## 2019-02-03 DIAGNOSIS — L299 Pruritus, unspecified: Secondary | ICD-10-CM

## 2019-02-06 MED ORDER — HYDROXYZINE HCL 50 MG PO TABS: 50.0000 mg | ORAL_TABLET | Freq: Three times a day (TID) | ORAL | 1 refills | Status: DC | PRN

## 2019-02-21 ENCOUNTER — Telehealth: Payer: Self-pay | Admitting: Family Medicine

## 2019-02-21 MED ORDER — VERAPAMIL HCL ER 180 MG PO CP24: 180.0000 mg | ORAL_CAPSULE | Freq: Two times a day (BID) | ORAL | 3 refills | Status: DC

## 2019-02-21 NOTE — Telephone Encounter (Signed)
[facility]

## 2019-02-28 ENCOUNTER — Other Ambulatory Visit: Payer: Self-pay | Admitting: Family Medicine

## 2019-02-28 DIAGNOSIS — L299 Pruritus, unspecified: Secondary | ICD-10-CM

## 2019-02-28 NOTE — Telephone Encounter (Signed)
Please review script. Looks right to me. [place]

## 2019-03-30 ENCOUNTER — Other Ambulatory Visit: Payer: Self-pay | Admitting: Family Medicine

## 2019-03-30 DIAGNOSIS — E876 Hypokalemia: Secondary | ICD-10-CM

## 2019-04-05 ENCOUNTER — Encounter: Payer: Self-pay | Admitting: Family Medicine

## 2019-04-05 ENCOUNTER — Other Ambulatory Visit: Payer: Self-pay

## 2019-04-05 ENCOUNTER — Other Ambulatory Visit: Payer: Self-pay | Admitting: Family Medicine

## 2019-04-05 ENCOUNTER — Ambulatory Visit: Payer: BC Managed Care – PPO | Admitting: Family Medicine

## 2019-04-05 VITALS — BP 130/84 | HR 77 | Temp 98.1°F | Wt 356.6 lb

## 2019-04-05 DIAGNOSIS — L03119 Cellulitis of unspecified part of limb: Secondary | ICD-10-CM

## 2019-04-05 DIAGNOSIS — Z1211 Encounter for screening for malignant neoplasm of colon: Secondary | ICD-10-CM

## 2019-04-05 DIAGNOSIS — R7302 Impaired glucose tolerance (oral): Secondary | ICD-10-CM

## 2019-04-05 DIAGNOSIS — L299 Pruritus, unspecified: Secondary | ICD-10-CM

## 2019-04-05 DIAGNOSIS — J309 Allergic rhinitis, unspecified: Secondary | ICD-10-CM

## 2019-04-05 DIAGNOSIS — I10 Essential (primary) hypertension: Secondary | ICD-10-CM

## 2019-04-05 MED ORDER — CEPHALEXIN 500 MG PO CAPS: 500.0000 mg | ORAL_CAPSULE | Freq: Three times a day (TID) | ORAL | 0 refills | Status: DC

## 2019-04-05 NOTE — Progress Notes (Signed)
   Subjective:    Patient ID: Jeremy Ward, male    DOB: 02/28/1956, 63 y.o.   MRN: [id number]  HPI He is here for medication check.  He has had difficulty with cellulitis in his calf areas for quite some time.  He has tried doxycycline, Septra and Keflex with mixed results.  Most recently he was given a medication this did help briefly but then over the last month he is noted increased difficulty with redness and itching especially in the anterior shin area.  He does state that the sulfa did cause dry mouth.  I was his most recent antibiotic.  He otherwise continues on his hydroxyzine for the itching as well as verapamil and HCTZ.  He does have underlying allergies and these seem to be under fairly good control.  His weight is unchanged and he admits to not exercising with any regularity. Review of systems indicates need for colonoscopy.  Review of Systems     Objective:   Physical Exam Alert and in no distress.  Cardiac exam shows regular rhythm without murmurs or gallops.  Lungs are clear to auscultation.  Lower extremity exam shows both shins to be erythematous, the right side is more brownish than the left.  There       Assessment & Plan:  Cellulitis of lower extremity, unspecified laterality - Plan: cephALEXin (KEFLEX) 500 MG capsule, we did decided to treat this with Keflex for the next month.  He will keep me informed.  Discussed possible referral to infectious disease.  There is certainly an element of lymphedema with this.  Morbid obesity ([place]) - Plan: I discussed his weight with him in regard to diet and exercise.  We will consider referring him to the medical weight loss and wellness center based on his desire and ability to work it into his work schedule.  Essential hypertension - Plan: Continue on present medications.  Glucose intolerance (impaired glucose tolerance) - Plan: As above concerning weight loss.  Itching - Plan: He will continue to use the hydroxyzine.   Screening for colon cancer - Plan: Cologuard,   Allergic rhinitis, unspecified seasonality, unspecified trigger - Plan: Continue on present medication regimen.

## 2019-04-26 LAB — COLOGUARD: Cologuard: NEGATIVE

## 2019-05-09 NOTE — Progress Notes (Signed)
Pt was left a voice mail to call for results negative  cologuard  Results . [place]

## 2019-05-17 ENCOUNTER — Encounter: Payer: Self-pay | Admitting: Family Medicine

## 2019-05-17 MED ORDER — SULFAMETHOXAZOLE-TRIMETHOPRIM 800-160 MG PO TABS: 1.0000 | ORAL_TABLET | Freq: Two times a day (BID) | ORAL | 0 refills | Status: DC

## 2019-06-02 ENCOUNTER — Telehealth: Payer: BC Managed Care – PPO | Admitting: Physician Assistant

## 2019-06-02 DIAGNOSIS — R059 Cough, unspecified: Secondary | ICD-10-CM

## 2019-06-02 DIAGNOSIS — R05 Cough: Secondary | ICD-10-CM | POA: Diagnosis not present

## 2019-06-02 MED ORDER — AZELASTINE HCL 0.1 % NA SOLN: 2.0000 | Freq: Two times a day (BID) | NASAL | 0 refills | Status: DC

## 2019-06-02 MED ORDER — BENZONATATE 100 MG PO CAPS: 100.0000 mg | ORAL_CAPSULE | Freq: Three times a day (TID) | ORAL | 0 refills | Status: DC | PRN

## 2019-06-02 NOTE — Progress Notes (Signed)
We are sorry you are not feeling well.  Here is how we plan to help!  Based on what you have shared with me, it looks like you may have a viral upper respiratory infection.  Upper respiratory infections are caused by a large number of viruses; rhinovirus is the most common cause. The most common symptoms associated with an upper respiratory infection are nasal discharge or congestion, cough, sneezing, headache and pressure in the ears and face.  These symptoms usually persist for about 3 to 10 days, but can last up to 2 weeks.  It is important to know that upper respiratory infections do not cause serious illness or complications in most cases.     However, with the current covid-19 pandemic, covid 19 is also a possibility due to your symptoms.   Many health care providers can now test patients at their office but not all are.  [facility] has multiple testing sites. For information on our COVID testing locations and hours go to [email or web address]  Please quarantine yourself while awaiting your test results.  We are enrolling you in our [place] for [place] . Daily you will receive a questionnaire within the [place] website. Our COVID 19 response team willl be monitoriing your responses daily.   COVID-19 is a respiratory illness with symptoms that are similar to the flu. Symptoms are typically mild to moderate, but there have been cases of severe illness and death due to the virus. The following symptoms may appear 2-14 days after exposure: . Fever . Cough . Shortness of breath or difficulty breathing . Chills . Repeated shaking with chills . Muscle pain . Headache . Sore throat . New loss of taste or smell . Fatigue . Congestion or runny nose . Nausea or vomiting . Diarrhea  It is vitally important that if you feel that you have an infection such as this virus or any other virus that you stay home and away from places where you may spread it to  others.  You should self-quarantine for 14 days if you have symptoms that could potentially be coronavirus or have been in close contact a with a person diagnosed with COVID-19 within the last 2 weeks. You should avoid contact with people age 13 and older.   You should wear a mask or cloth face covering over your nose and mouth if you must be around other people or animals, including pets (even at home). Try to stay at least 6 feet away from other people. This will protect the people around you.  This is an infection that is most likely caused by a virus. There are no specific treatments other than to help you with the symptoms until the infection runs its course.  We are sorry you are not feeling well.  Here is how we plan to help!   For nasal congestion, you may use an oral decongestants such as Mucinex D or if you have glaucoma or high blood pressure use plain Mucinex.  Saline nasal spray or nasal drops can help and can safely be used as often as needed for congestion.  For your congestion, I have prescribed Azelastine nasal spray two sprays in each nostril twice a day  If you do not have a history of heart disease, hypertension, diabetes or thyroid disease, prostate/bladder issues or glaucoma, you may also use Sudafed to treat nasal congestion.  It is highly recommended that you consult with a pharmacist or your primary care physician to ensure this medication is  safe for you to take.     If you have a cough, you may use cough suppressants such as Delsym and Robitussin.  If you have glaucoma or high blood pressure, you can also use Coricidin HBP.   For cough I have prescribed for you A prescription cough medication called Tessalon Perles 100 mg. You may take 1-2 capsules every 8 hours as needed for cough  If you have a sore or scratchy throat, use a saltwater gargle-  to  teaspoon of salt dissolved in a 4-ounce to 8-ounce glass of warm water.  Gargle the solution for approximately 15-30 seconds  and then spit.  It is important not to swallow the solution.  You can also use throat lozenges/cough drops and Chloraseptic spray to help with throat pain or discomfort.  Warm or cold liquids can also be helpful in relieving throat pain.  For headache, pain or general discomfort, you can use Ibuprofen or Tylenol as directed.   Some authorities believe that zinc sprays or the use of Echinacea may shorten the course of your symptoms.   HOME CARE . Only take medications as instructed by your medical team. . Be sure to drink plenty of fluids. Water is fine as well as fruit juices, sodas and electrolyte beverages. You may want to stay away from caffeine or alcohol. If you are nauseated, try taking small sips of liquids. How do you know if you are getting enough fluid? Your urine should be a pale yellow or almost colorless. . Get rest. . Taking a steamy shower or using a humidifier may help nasal congestion and ease sore throat pain. You can place a towel over your head and breathe in the steam from hot water coming from a faucet. . Using a saline nasal spray works much the same way. . Cough drops, hard candies and sore throat lozenges may ease your cough. . Avoid close contacts especially the very young and the elderly . Cover your mouth if you cough or sneeze . Always remember to wash your hands.   GET HELP RIGHT AWAY IF: . You develop worsening fever. . If your symptoms do not improve within 10 days . You develop yellow or green discharge from your nose over 3 days. . You have coughing fits . You develop a severe head ache or visual changes. . You develop shortness of breath, difficulty breathing or start having chest pain . Your symptoms persist after you have completed your treatment plan .  GET HELP RIGHT AWAY IF YOU HAVE EMERGENCY WARNING SIGNS** FOR COVID-19. If you or someone is showing any of these signs seek emergency medical care immediately. Call 911 or proceed to your closest  emergency facility if: . You develop worsening high fever. . Trouble breathing . Bluish lips or face . Persistent pain or pressure in the chest . New confusion . Inability to wake or stay awake . You cough up blood. . Your symptoms become more severe  **This list is not all possible symptoms. Contact your medical provider for any symptoms that are sever or concerning to you.  MAKE SURE YOU   Understand these instructions.  Will watch your condition.  Will get help right away if you are not doing well or get worse.   Your e-visit answers were reviewed by a board certified advanced clinical practitioner to complete your personal care plan. Depending upon the condition, your plan could have included both over the counter or prescription medications. Please review your pharmacy choice.  If there is a problem, you may call our nursing hot line at and have the prescription routed to another pharmacy. Your safety is important to [place]. If you have drug allergies check your prescription carefully.   You can use MyChart to ask questions about today's visit, request a non-urgent call back, or ask for a work or school excuse for 24 hours related to this e-Visit. If it has been greater than 24 hours you will need to follow up with your provider, or enter a new e-Visit to address those concerns. You will get an e-mail in the next two days asking about your experience.  I hope that your e-visit has been valuable and will speed your recovery. Thank you for using e-visits.  I have spent 5 minutes in review of e-visit questionnaire, review and updating patient chart, medical decision making and response to patient.    Tenna Delaine, PA-C

## 2019-06-03 ENCOUNTER — Encounter (INDEPENDENT_AMBULATORY_CARE_PROVIDER_SITE_OTHER): Payer: Self-pay

## 2019-06-03 ENCOUNTER — Telehealth: Payer: Self-pay

## 2019-06-03 NOTE — Telephone Encounter (Signed)
1. ONSET: "When did the cough begin?" Thursday 2. SEVERITY: "How bad is the cough today?"  Worse than yesterday productive. 3. RESPIRATORY DISTRESS: "Describe your breathing."  No problem 4. FEVER: "Do you have a fever?" If so, ask: "What is your temperature, how was it measured, and when did it start?"  Unsure  5. SPUTUM: "Describe the color of your sputum" (clear, white, yellow, green)  yellow 6. HEMOPTYSIS: "Are you coughing up any blood?" If so ask: "How much?" (flecks, streaks, tablespoons, etc.)  no 7. CARDIAC HISTORY: "Do you have any history of heart disease?" (e.g., heart attack, congestive heart failure)  no 8. LUNG HISTORY: "Do you have any history of lung disease?" (e.g., pulmonary embolus, asthma, emphysema)  none 9. PE RISK FACTORS: "Do you have a history of blood clots?" (or: recent major surgery, recent prolonged travel, bedridden)  10. OTHER SYMPTOMS: "Do you have any other symptoms?" (e.g., runny nose, wheezing, chest pain)  Stuffy nose, alergies, chest hurts off and on 11. PREGNANCY: "Is there any chance you are pregnant?" "When was your last menstrual period?" N/A 12. TRAVEL: "Have you traveled out of the country in the last month?" (e.g., travel history, exposures) No     Contacted patient who reports a worsening of his cough. He states that it has become productive today. He had a E visit with his PCP yesterday. He is prescribed tessalon pearls and  Astelin nasal spray, and atarax for nerves. He states he has not been tested for the COVID-19. He feels that he has allergies and these symptoms every year. Other symptoms include Headache. He states his cough does not keep him awake at night. He was encouraged to go and be tested for COVID-19 and was given times and locations. He was instructed to contact his doctor or go to urgent care this weekend for wheezing, SOB. Care advice included continue his PCP treatment plan. I reviewed OTC treatments for cough. Hard candy and  cough drops for throat irritation and using a humidifier.  Patient verbalized understanding.

## 2019-06-04 ENCOUNTER — Encounter (INDEPENDENT_AMBULATORY_CARE_PROVIDER_SITE_OTHER): Payer: Self-pay

## 2019-06-05 ENCOUNTER — Encounter (INDEPENDENT_AMBULATORY_CARE_PROVIDER_SITE_OTHER): Payer: Self-pay

## 2019-06-05 ENCOUNTER — Other Ambulatory Visit: Payer: Self-pay

## 2019-06-05 DIAGNOSIS — Z20822 Contact with and (suspected) exposure to covid-19: Secondary | ICD-10-CM

## 2019-06-06 ENCOUNTER — Encounter (INDEPENDENT_AMBULATORY_CARE_PROVIDER_SITE_OTHER): Payer: Self-pay

## 2019-06-07 ENCOUNTER — Encounter: Payer: Self-pay | Admitting: Family Medicine

## 2019-06-07 ENCOUNTER — Other Ambulatory Visit: Payer: Self-pay

## 2019-06-07 ENCOUNTER — Ambulatory Visit: Payer: BC Managed Care – PPO | Admitting: Family Medicine

## 2019-06-07 VITALS — Temp 98.1°F | Wt 356.0 lb

## 2019-06-07 DIAGNOSIS — U071 COVID-19: Secondary | ICD-10-CM | POA: Diagnosis not present

## 2019-06-07 LAB — NOVEL CORONAVIRUS, NAA: SARS-CoV-2, NAA: DETECTED — AB

## 2019-06-07 NOTE — Progress Notes (Signed)
   Subjective:    Patient ID: Jeremy Ward, male    DOB: 1956-08-04, 63 y.o.   MRN: [id number]  HPI Documentation for virtual telephone encounter.  Documentation for virtual audio and video telecommunications through [place] encounter: The patient was located at home. The provider was located in the office. The patient did consent to this visit and is aware of possible charges through their insurance for this visit. The other persons participating in this telemedicine service were none. Time spent on call was 5 minutes and in review of previous records >15 minutes total. This virtual service is not related to other E/M service within previous 7 days. He notes that on August 27 he developed symptoms of a dry cough, sinus pain and pressure as well as decreased sense of taste.  He had an ED visit on the 28th and had COVID testing done Monday.  He got the results late last night and it was positive.  His taste has returned.  He still does complain of dry cough, sinus pressure, malaise and fatigue.  He is concerned about a sinus infection.  Does have a previous history of difficulty with sinus infections.  Review of Systems     Objective:   Physical Exam Alert and in no distress otherwise not examined       Assessment & Plan:  COVID-19 virus infection I explained that this is a viral infection and not necessarily a bacterial sinus infection.  Recommend 2 Tylenol 4 times per day as needed for pain as well as is for Advil 3 times per day.  He will keep track of his symptoms and if the sinus symptoms get worse, he will call me next Tuesday. At the end of the encounter he mentioned the possibility of possibly having ringworm on his back and I recommended using Lamisil AF.

## 2019-06-09 ENCOUNTER — Encounter (INDEPENDENT_AMBULATORY_CARE_PROVIDER_SITE_OTHER): Payer: Self-pay

## 2019-06-10 ENCOUNTER — Encounter (INDEPENDENT_AMBULATORY_CARE_PROVIDER_SITE_OTHER): Payer: Self-pay

## 2019-06-11 ENCOUNTER — Encounter (INDEPENDENT_AMBULATORY_CARE_PROVIDER_SITE_OTHER): Payer: Self-pay

## 2019-06-12 ENCOUNTER — Encounter (INDEPENDENT_AMBULATORY_CARE_PROVIDER_SITE_OTHER): Payer: Self-pay

## 2019-06-13 ENCOUNTER — Encounter: Payer: Self-pay | Admitting: Family Medicine

## 2019-06-13 ENCOUNTER — Encounter (INDEPENDENT_AMBULATORY_CARE_PROVIDER_SITE_OTHER): Payer: Self-pay

## 2019-06-13 MED ORDER — ALBUTEROL SULFATE HFA 108 (90 BASE) MCG/ACT IN AERS: 2.0000 | INHALATION_SPRAY | Freq: Four times a day (QID) | RESPIRATORY_TRACT | 0 refills | Status: DC | PRN

## 2019-06-15 ENCOUNTER — Telehealth: Payer: Self-pay | Admitting: Family Medicine

## 2019-06-15 ENCOUNTER — Encounter: Payer: Self-pay | Admitting: Family Medicine

## 2019-06-15 ENCOUNTER — Telehealth: Payer: Self-pay

## 2019-06-15 ENCOUNTER — Ambulatory Visit: Payer: BC Managed Care – PPO | Admitting: Family Medicine

## 2019-06-15 ENCOUNTER — Other Ambulatory Visit: Payer: Self-pay

## 2019-06-15 VITALS — Wt 356.0 lb

## 2019-06-15 DIAGNOSIS — U071 COVID-19: Secondary | ICD-10-CM | POA: Diagnosis not present

## 2019-06-15 NOTE — Progress Notes (Signed)
   Subjective:    Patient ID: Jeremy Ward, male    DOB: 04/11/1956, 63 y.o.   MRN: [id number]  HPI Documentation for virtual telephone encounter.  Documentation for virtual audio and video telecommunications through [place] encounter: The patient was located at home. The provider was located in the office. The patient did consent to this visit and is aware of possible charges through their insurance for this visit. The other persons participating in this telemedicine service were none. Time spent on call was 5 minutes and in review of previous records >10 minutes total. This virtual service is not related to other E/M service within previous 7 days. This is a follow-up consult concerning COVID.  He is now 2 weeks from when he started having symptoms.  He notes that his headache has diminished and his appetite is increased.  He also is having less difficulty with coughing but still feels some fatigue.  He also had difficulty with diarrhea however this has cleared up as well.  He also states that his sinus symptoms have improved.  He states he is now roughly 80% better.  Review of Systems     Objective:   Physical Exam Alert and in no distress otherwise not examined       Assessment & Plan:  COVID-19 virus infection I will write him a note to return to work next Monday.  It was state that he is fulfilled all the CDC recommendations for return to work.

## 2019-06-15 NOTE — Telephone Encounter (Signed)
Letter typed and emailed to pt, called and informed pt, he also wanted to know when he should get flu shot, per Dr. Redmond School he should wait 2 weeks.

## 2019-06-15 NOTE — Telephone Encounter (Signed)
Called pt to advise of the need for a virtual appt today per JCL. When pt calls back please make appt. [place]

## 2019-06-26 ENCOUNTER — Other Ambulatory Visit: Payer: Self-pay

## 2019-06-26 DIAGNOSIS — Z20822 Contact with and (suspected) exposure to covid-19: Secondary | ICD-10-CM

## 2019-06-28 LAB — NOVEL CORONAVIRUS, NAA: SARS-CoV-2, NAA: DETECTED — AB

## 2019-06-29 ENCOUNTER — Encounter: Payer: Self-pay | Admitting: Family Medicine

## 2019-07-01 MED ORDER — BENZONATATE 100 MG PO CAPS: 100.0000 mg | ORAL_CAPSULE | Freq: Three times a day (TID) | ORAL | 0 refills | Status: DC | PRN

## 2019-07-13 ENCOUNTER — Telehealth: Payer: Self-pay | Admitting: Family Medicine

## 2019-07-13 ENCOUNTER — Other Ambulatory Visit: Payer: Self-pay

## 2019-07-13 DIAGNOSIS — R059 Cough, unspecified: Secondary | ICD-10-CM

## 2019-07-13 DIAGNOSIS — R05 Cough: Secondary | ICD-10-CM

## 2019-07-13 NOTE — Telephone Encounter (Signed)
At this point if he keeps having difficulty, pulmonary referral would be needed.  If he wants to go ahead and set it up

## 2019-07-13 NOTE — Telephone Encounter (Signed)
Pt referral was put in  . [facility]

## 2019-07-13 NOTE — Telephone Encounter (Signed)
Pt called and states that he is still having a dry un productive cough, pt is still using the tessalon pearls, and using mucinex and states that is not helping, pt is wanting to know what else he can do, [id number] [place], [place] - [address] and pt can be reached at [phone]

## 2019-07-24 ENCOUNTER — Other Ambulatory Visit: Payer: Self-pay

## 2019-07-24 DIAGNOSIS — Z20822 Contact with and (suspected) exposure to covid-19: Secondary | ICD-10-CM

## 2019-07-26 ENCOUNTER — Encounter: Payer: Self-pay | Admitting: Family Medicine

## 2019-07-26 LAB — NOVEL CORONAVIRUS, NAA: SARS-CoV-2, NAA: NOT DETECTED

## 2019-07-27 ENCOUNTER — Encounter: Payer: Self-pay | Admitting: Family Medicine

## 2019-07-27 NOTE — Telephone Encounter (Signed)
Letter typed and e-mailed to patient.

## 2019-08-11 ENCOUNTER — Other Ambulatory Visit: Payer: Self-pay | Admitting: Family Medicine

## 2019-11-09 ENCOUNTER — Other Ambulatory Visit: Payer: Self-pay | Admitting: Family Medicine

## 2019-11-09 DIAGNOSIS — L299 Pruritus, unspecified: Secondary | ICD-10-CM

## 2019-11-09 NOTE — Telephone Encounter (Signed)
[facility] . Please advise [facility]

## 2019-12-02 ENCOUNTER — Other Ambulatory Visit: Payer: Self-pay | Admitting: Family Medicine

## 2020-01-20 ENCOUNTER — Telehealth: Payer: BC Managed Care – PPO | Admitting: Physician Assistant

## 2020-01-20 DIAGNOSIS — J019 Acute sinusitis, unspecified: Secondary | ICD-10-CM

## 2020-01-20 MED ORDER — AMOXICILLIN-POT CLAVULANATE 875-125 MG PO TABS: 1.0000 | ORAL_TABLET | Freq: Two times a day (BID) | ORAL | 0 refills | Status: DC

## 2020-01-20 NOTE — Progress Notes (Signed)

## 2020-01-22 ENCOUNTER — Other Ambulatory Visit: Payer: Self-pay | Admitting: Family Medicine

## 2020-01-23 ENCOUNTER — Telehealth: Payer: Self-pay

## 2020-01-23 NOTE — Telephone Encounter (Signed)
Called pt to advise of the need for a med check and CPE appt.  LVM for pt to call back to schedule [facility]

## 2020-01-25 ENCOUNTER — Telehealth: Payer: BC Managed Care – PPO | Admitting: Family Medicine

## 2020-01-25 ENCOUNTER — Other Ambulatory Visit: Payer: Self-pay

## 2020-01-25 ENCOUNTER — Encounter: Payer: Self-pay | Admitting: Family Medicine

## 2020-01-25 VITALS — Temp 98.0°F | Ht 65.5 in | Wt 350.0 lb

## 2020-01-25 DIAGNOSIS — J3489 Other specified disorders of nose and nasal sinuses: Secondary | ICD-10-CM

## 2020-01-25 DIAGNOSIS — J309 Allergic rhinitis, unspecified: Secondary | ICD-10-CM

## 2020-01-25 MED ORDER — AZELASTINE HCL 0.1 % NA SOLN: 2.0000 | Freq: Two times a day (BID) | NASAL | 0 refills | Status: DC

## 2020-01-25 MED ORDER — METHYLPREDNISOLONE 4 MG PO TBPK: ORAL_TABLET | ORAL | 0 refills | Status: AC

## 2020-01-25 MED ORDER — FLUTICASONE PROPIONATE 50 MCG/ACT NA SUSP: 2.0000 | Freq: Every day | NASAL | 6 refills | Status: DC

## 2020-01-25 NOTE — Progress Notes (Signed)
Start time: 12:14 End time: 12:43  Virtual Visit via Video Note  I connected with Jeremy Ward on 04/22/21by a video enabled telemedicine application and verified that I am speaking with the correct person using two identifiers.  Location: Patient: home Provider: office   I discussed the limitations of evaluation and management by telemedicine and the availability of in person appointments. The patient expressed understanding and agreed to proceed.  History of Present Illness:  Chief Complaint  Patient presents with  . Facial Pain    VIRTUAL sinus pain and burning, right nostril is stuffed up. Neck hurts, ear pain. Has been on abx through an e-visit. No fever, but does have chills, cannot even get any mucus up to tell me if it is discolored or not.    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  Last week his sinuses started flaring, started having headaches, felt like head was in a vice.  Sinus pain comes and goes, as does the nasal congestion. He is having pain in all of the sinuses.  At the current moment, his pain is worse in the right cheek and forehead.  There is no nasal drainage.  Denies sore throat, PND.  Only an occasional cough. He sneezed a few times yesterday. No itchy eyes.  He is mainly complaining of sinus pain and headache. He tried hot steam in shower, nasal mist.  He has tried menthol under the nose. Ibuprofen and tylenol haven't helped much. He skipped diclofenac on the days he took ibuprofen. He took Mucinex on Monday and Tuesday, thinks it made him feel worse so stopped it. His daughter got him sudafed PE, took it 4x yesterday and this morning. Hasn't helped much.  Hasn't been checking his BP, can't find the monitor. Last dose 7am.  Had e-visit on 4/17 for these complaints, and he was prescribed Augmentin. He started this on Saturday--hasn't really noticed any improvement--sometimes pain lightens up, but then recurs.  No sick contacts.   He had chills yesterday afternoon, no known fever.  He is also getting pain in the front of the neck, sometimes feels like food gets stuck in his throat when trying to swallow. Not aware of swollen glands. No neck stiffness.  He recalls feeling nervous/jumpy/twitchy when used steroids in the past.  Helped at first, but felt that way a few days into the course.   PMH, PSH, SH reviewed N[facility].    Outpatient Encounter Medications as of 01/25/2020  Medication Sig Note  . amoxicillin-clavulanate (AUGMENTIN) 875-125 MG tablet Take 1 tablet by mouth 2 (two) times daily.   . diclofenac sodium (VOLTAREN) 1 % GEL Apply 2 g topically 4 (four) times daily.   . hydrochlorothiazide (HYDRODIURIL) 25 MG tablet TAKE 1 TABLET BY MOUTH EVERY DAY   . ibuprofen (ADVIL) 200 MG tablet Take 400 mg by mouth every 6 (six) hours as needed. 01/25/2020: Last dose 7am  . KLOR-CON M20 20 MEQ tablet TAKE 1 TABLET BY MOUTH EVERY DAY   . Multiple Vitamins-Minerals (MULTI ADULT GUMMIES PO) Take by mouth.   . phenylephrine (SUDAFED PE) 10 MG TABS tablet Take 10 mg by mouth every 4 (four) hours as needed.   . verapamil (VERELAN PM) 180 MG 24 hr capsule TAKE 1 CAPSULE (180 MG TOTAL) BY MOUTH 2 (TWO) TIMES DAILY.   Marland Kitchen acetaminophen (TYLENOL) 500 MG tablet Take 1,000 mg by mouth every morning. 11/09/2014: Prn   . albuterol (VENTOLIN HFA) 108 (90 Base) MCG/ACT  inhaler Inhale 2 puffs into the lungs every 6 (six) hours as needed for wheezing or shortness of breath. (Patient not taking: Reported on 01/25/2020)   . azelastine (ASTELIN) 0.1 % nasal spray Place 2 sprays into both nostrils 2 (two) times daily. Use in each nostril as directed (Patient not taking: Reported on 06/07/2019) 01/25/2020: Ran out  . diclofenac (VOLTAREN) 75 MG EC tablet Take 1 tablet (75 mg total) by mouth 2 (two) times daily. (Patient not taking: Reported on 01/25/2020) 01/25/2020: Stopped when he started the ibuprofen  . fluticasone (FLONASE) 50 MCG/ACT nasal spray  Place 2 sprays into both nostrils daily. (Patient not taking: Reported on 06/07/2019) 01/25/2020: Ran out  . hydrOXYzine (ATARAX/VISTARIL) 50 MG tablet TAKE 1 TABLET (50 MG TOTAL) BY MOUTH 3 (THREE) TIMES DAILY AS NEEDED. (Patient not taking: Reported on 01/25/2020)   . [DISCONTINUED] benzonatate (TESSALON) 100 MG capsule Take 1 capsule (100 mg total) by mouth 3 (three) times daily as needed for cough.   . [DISCONTINUED] Cyanocobalamin (VITAMIN B 12 PO) Take by mouth.   . [DISCONTINUED] diclofenac Sodium (VOLTAREN) 1 % GEL SMARTSIG:2 Gram(s) Topical 3 Times Daily PRN   . [DISCONTINUED] sulfamethoxazole-trimethoprim (BACTRIM DS) 800-160 MG tablet Take 1 tablet by mouth 2 (two) times daily. (Patient not taking: Reported on 06/07/2019)   . [DISCONTINUED] triamcinolone (NASACORT ALLERGY 24HR) 55 MCG/ACT AERO nasal inhaler Place 2 sprays into the nose daily. Reported on 12/06/2015 (Patient not taking: Reported on 04/05/2019)    No facility-administered encounter medications on file as of 01/25/2020.   Uses Afrin at bedtime, to help get to sleep Used mucinex x 2 days, none since Tuesday.  No Known Allergies  ROS: no fever, chills yesterday, headache and trouble swallowing, occasional cough per HPI. No chest pain, palpitations, shortness of breath. No nausea, vomiting, diarrhea or rash. +fatigue, not sleeping well.  See HPI    Observations/Objective:  Temp 98 F (36.7 C) (Temporal)   Ht 5' 5.5" (1.664 m)   Wt (!) 350 lb (158.8 kg)   BMI 57.36 kg/m   Pleasant older gentleman in no acute distress. He is alert, oriented, EOMI, cranial nerves grossly intact. Anterior neck appears normal when he leaned his head back (no lumps, rash). He is speaking easily during visit, no coughing or throat-clearing. Exam is limited due to the virtual nature of the visit.  Assessment and Plan:  Sinus pain - on 5d of Augmentin without much improvement. To complete course. Add sinus rinse, Flonase. Steroids if not  improving - Plan: methylPREDNISolone (MEDROL DOSEPAK) 4 MG TBPK tablet  Allergic rhinitis, unspecified seasonality, unspecified trigger - restart flonase and astelin, counseled on proper use and expectations. - Plan: azelastine (ASTELIN) 0.1 % nasal spray, fluticasone (FLONASE) 50 MCG/ACT nasal spray, methylPREDNISolone (MEDROL DOSEPAK) 4 MG TBPK tablet   Resume flonase, astelin Sinus rinses. Use sudafed with caution--to monitor BP or not use. Start steroids if not improving with above measures. (risks/SE reviewed) Discussed nasal decongestants and risks.   Follow Up Instructions:    I discussed the assessment and treatment plan with the patient. The patient was provided an opportunity to ask questions and all were answered. The patient agreed with the plan and demonstrated an understanding of the instructions.   The patient was advised to call back or seek an in-person evaluation if the symptoms worsen or if the condition fails to improve as anticipated.  I provided 29 minutes of video face-to-face time during this encounter. Additional time spent in chart review and  documentation.   Vikki Ports, MD

## 2020-01-25 NOTE — Patient Instructions (Addendum)
Drink plenty of water. Start using flonase--2 gentle sniffs into each nostril once daily. You can also restart Astelin (2 sprays twice daily into each nostril).  I recommend using sinus rinse kit (or neti-pot, whichever you prefer), twice daily to help with sinus pain/pressure.  Mucinex may also help loosen up the mucus that may be plugging the sinuses, and also causing some of the congestion in the throat.  I don't recommend using sudafed unless you are able to monitor your blood pressure (okay to use only if BP less than 130/80, and doesn't go over 150/90 while taking it)  Complete the course of antibiotics.  If you aren't getting with the above measures, then go ahead and start the steroid pack.  You may continue to use tylenol if needed for pain, along with the above medications. You can use EITHER ibuprofen or diclofenac, whichever you feel is more effective. Be sure to take these with food. I wouldn't take either of these medications if/when you are on the steroid course.  I hope you feel better soon!

## 2020-02-16 ENCOUNTER — Other Ambulatory Visit: Payer: Self-pay | Admitting: Family Medicine

## 2020-02-16 DIAGNOSIS — J309 Allergic rhinitis, unspecified: Secondary | ICD-10-CM

## 2020-03-13 ENCOUNTER — Other Ambulatory Visit: Payer: Self-pay | Admitting: Family Medicine

## 2020-03-13 DIAGNOSIS — J309 Allergic rhinitis, unspecified: Secondary | ICD-10-CM

## 2020-04-02 ENCOUNTER — Telehealth: Payer: Self-pay | Admitting: Family Medicine

## 2020-04-02 ENCOUNTER — Encounter: Payer: Self-pay | Admitting: Family Medicine

## 2020-04-02 ENCOUNTER — Ambulatory Visit: Payer: BC Managed Care – PPO | Admitting: Family Medicine

## 2020-04-02 ENCOUNTER — Other Ambulatory Visit: Payer: Self-pay

## 2020-04-02 VITALS — BP 144/88 | HR 87 | Temp 97.1°F | Wt 347.0 lb

## 2020-04-02 DIAGNOSIS — I10 Essential (primary) hypertension: Secondary | ICD-10-CM | POA: Diagnosis not present

## 2020-04-02 DIAGNOSIS — R7302 Impaired glucose tolerance (oral): Secondary | ICD-10-CM

## 2020-04-02 DIAGNOSIS — J309 Allergic rhinitis, unspecified: Secondary | ICD-10-CM

## 2020-04-02 DIAGNOSIS — Z96641 Presence of right artificial hip joint: Secondary | ICD-10-CM

## 2020-04-02 MED ORDER — VERAPAMIL HCL ER 180 MG PO CP24: 180.0000 mg | ORAL_CAPSULE | Freq: Two times a day (BID) | ORAL | 0 refills | Status: DC

## 2020-04-02 MED ORDER — HYDROCHLOROTHIAZIDE 25 MG PO TABS: 25.0000 mg | ORAL_TABLET | Freq: Every day | ORAL | 0 refills | Status: DC

## 2020-04-02 NOTE — Telephone Encounter (Signed)
Pt needs ref[facility]Z & [facility]apamil [facility] [address] for 90 days each

## 2020-04-02 NOTE — Progress Notes (Signed)
   Subjective:    Patient ID: Jeremy Ward, male    DOB: September 26, 1956, 64 y.o.   MRN: [id number]  HPI He is here for a med check appointment. He does have right hip pain and has had 2 replacements but still having difficulty with that. His allergies seem to be under good control on his present medication regimen. His blood pressure is slightly elevated. His weight is unchanged. He does not exercise and at this point is really not interested in making any major lifestyle changes to help with that. There is a previous history of glucose intolerance.   Review of Systems     Objective:   Physical Exam Alert and in no distress. Tympanic membranes and canals are normal. Pharyngeal area is normal. Neck is supple without adenopathy or thyromegaly. Cardiac exam shows a regular sinus rhythm without murmurs or gallops. Lungs are clear to auscultation.       Assessment & Plan:  Essential hypertension - Plan: CBC with Differential/Platelet, Comprehensive metabolic panel  Allergic rhinitis, unspecified seasonality, unspecified trigger  Morbid obesity ([place]) - Plan: CBC with Differential/Platelet, Comprehensive metabolic panel, Lipid panel  Glucose intolerance (impaired glucose tolerance) - Plan: CBC with Differential/Platelet, Comprehensive metabolic panel  S/P hip replacement, right Continue with his present blood pressure medication but come back here in a couple months. Bring the blood pressure cuff with him and we'll measure it against ours. Continue on his present allergy medications. I have discussed in the past diet and exercise with him as well as risk of diabetes and at this point he is not interested in making any changes in his lifestyle. He is not interested in pursuing the right hip pain any further as he knows his weight is playing a major role in life.

## 2020-04-03 LAB — CBC WITH DIFFERENTIAL/PLATELET
Basophils Absolute: 0.1 10*3/uL (ref 0.0–0.2)
Basos: 1 %
EOS (ABSOLUTE): 0.1 10*3/uL (ref 0.0–0.4)
Eos: 2 %
Hematocrit: 41.9 % (ref 37.5–51.0)
Hemoglobin: 14.4 g/dL (ref 13.0–17.7)
Immature Grans (Abs): 0 10*3/uL (ref 0.0–0.1)
Immature Granulocytes: 0 %
Lymphocytes Absolute: 1.5 10*3/uL (ref 0.7–3.1)
Lymphs: 24 %
MCH: 30.2 pg (ref 26.6–33.0)
MCHC: 34.4 g/dL (ref 31.5–35.7)
MCV: 88 fL (ref 79–97)
Monocytes Absolute: 0.5 10*3/uL (ref 0.1–0.9)
Monocytes: 8 %
Neutrophils Absolute: 4 10*3/uL (ref 1.4–7.0)
Neutrophils: 65 %
Platelets: 191 10*3/uL (ref 150–450)
RBC: 4.77 x10E6/uL (ref 4.14–5.80)
RDW: 13.8 % (ref 11.6–15.4)
WBC: 6.3 10*3/uL (ref 3.4–10.8)

## 2020-04-03 LAB — LIPID PANEL
Chol/HDL Ratio: 4 ratio (ref 0.0–5.0)
Cholesterol, Total: 200 mg/dL — ABNORMAL HIGH (ref 100–199)
HDL: 50 mg/dL (ref >39)
LDL Chol Calc (NIH): 123 mg/dL — ABNORMAL HIGH (ref 0–99)
Triglycerides: 153 mg/dL — ABNORMAL HIGH (ref 0–149)
VLDL Cholesterol Cal: 27 mg/dL (ref 5–40)

## 2020-04-03 LAB — COMPREHENSIVE METABOLIC PANEL
ALT: 13 IU/L (ref 0–44)
AST: 18 IU/L (ref 0–40)
Albumin/Globulin Ratio: 2.4 — ABNORMAL HIGH (ref 1.2–2.2)
Albumin: 4.6 g/dL (ref 3.8–4.8)
Alkaline Phosphatase: 69 IU/L (ref 48–121)
BUN/Creatinine Ratio: 20 (ref 10–24)
BUN: 23 mg/dL (ref 8–27)
Bilirubin Total: 0.5 mg/dL (ref 0.0–1.2)
CO2: 24 mmol/L (ref 20–29)
Calcium: 9.1 mg/dL (ref 8.6–10.2)
Chloride: 102 mmol/L (ref 96–106)
Creatinine, Ser: 1.14 mg/dL (ref 0.76–1.27)
GFR calc Af Amer: 79 mL/min/{1.73_m2} (ref >59)
GFR calc non Af Amer: 68 mL/min/{1.73_m2} (ref >59)
Globulin, Total: 1.9 g/dL (ref 1.5–4.5)
Glucose: 113 mg/dL — ABNORMAL HIGH (ref 65–99)
Potassium: 3.3 mmol/L — ABNORMAL LOW (ref 3.5–5.2)
Sodium: 145 mmol/L — ABNORMAL HIGH (ref 134–144)
Total Protein: 6.5 g/dL (ref 6.0–8.5)

## 2020-04-04 LAB — HGB A1C W/O EAG: Hgb A1c MFr Bld: 5.4 % (ref 4.8–5.6)

## 2020-04-04 LAB — SPECIMEN STATUS REPORT

## 2020-04-11 ENCOUNTER — Other Ambulatory Visit: Payer: Self-pay | Admitting: Family Medicine

## 2020-04-11 DIAGNOSIS — J309 Allergic rhinitis, unspecified: Secondary | ICD-10-CM

## 2020-05-07 ENCOUNTER — Other Ambulatory Visit: Payer: Self-pay | Admitting: Family Medicine

## 2020-05-07 DIAGNOSIS — J309 Allergic rhinitis, unspecified: Secondary | ICD-10-CM

## 2020-05-30 ENCOUNTER — Ambulatory Visit: Payer: BC Managed Care – PPO | Admitting: Family Medicine

## 2020-06-25 ENCOUNTER — Other Ambulatory Visit: Payer: Self-pay | Admitting: Family Medicine

## 2020-07-28 ENCOUNTER — Other Ambulatory Visit: Payer: Self-pay | Admitting: Family Medicine

## 2020-07-28 DIAGNOSIS — J309 Allergic rhinitis, unspecified: Secondary | ICD-10-CM

## 2020-08-13 ENCOUNTER — Other Ambulatory Visit: Payer: Self-pay | Admitting: Family Medicine

## 2020-08-13 NOTE — Telephone Encounter (Signed)
[facility] is requesting to fill pt albuterol inhaler. [place]

## 2020-09-27 ENCOUNTER — Other Ambulatory Visit: Payer: Self-pay | Admitting: Family Medicine

## 2020-10-14 ENCOUNTER — Other Ambulatory Visit: Payer: Self-pay | Admitting: Family Medicine

## 2020-10-14 DIAGNOSIS — J309 Allergic rhinitis, unspecified: Secondary | ICD-10-CM

## 2020-10-23 ENCOUNTER — Telehealth: Payer: BC Managed Care – PPO | Admitting: Family

## 2020-10-23 DIAGNOSIS — J019 Acute sinusitis, unspecified: Secondary | ICD-10-CM

## 2020-10-23 MED ORDER — AMOXICILLIN-POT CLAVULANATE 875-125 MG PO TABS: 1.0000 | ORAL_TABLET | Freq: Two times a day (BID) | ORAL | 0 refills | Status: AC

## 2020-10-23 NOTE — Progress Notes (Signed)

## 2020-12-14 ENCOUNTER — Other Ambulatory Visit: Payer: Self-pay | Admitting: Family Medicine

## 2020-12-18 ENCOUNTER — Telehealth: Payer: Self-pay | Admitting: Family Medicine

## 2020-12-18 NOTE — Telephone Encounter (Signed)
Spoke with EMS, reported pt passed at home around 4:30am.  Family last saw him around 10pm last night, he complained of a little headache, went to bed.  Asking to sign off on death certificate.  Only info from paramedic was h/o HTN. ME apparently declined.  Advised Dr. [employer] returning tomorrow; they will be calling funeral home, so body will be housed elsewhere (out of home) while discussing this.  Last seen 03/2020, BP was above goal.  Pt was to return in 2 months to f/u on this, but he cancelled appt, not seen in-office since (e-visit for illness only)

## 2020-12-19 NOTE — Telephone Encounter (Signed)
Called EMS Jeremy Ward [phone] to obtain death certificate.  No answer, left voice mail.

## 2020-12-19 NOTE — Telephone Encounter (Signed)
Can you follow-up on this for me.  I will sign the death certificate

## 2020-12-24 NOTE — Telephone Encounter (Signed)
Should be in the hands of the [facility] now (EMS no longer involved).  Not sure if there will be anything in [place] yet, or if funeral home will be contacting our office directly

## 2020-12-24 NOTE — Telephone Encounter (Signed)
No obituaries listed this patient.  Couldn't find where he was, so I called the EMS. The death certificate showed up here yesterday.

## 2021-01-03 DEATH — deceased
